# Patient Record
Sex: Male | Born: 1935 | Race: White | Hispanic: No | Smoking: Never smoker
Health system: Southern US, Community
[De-identification: ages and names within clinical notes are randomized; demographics above are authoritative.]

## PROBLEM LIST (undated history)

## (undated) DIAGNOSIS — I5022 Chronic systolic (congestive) heart failure: Secondary | ICD-10-CM

## (undated) DIAGNOSIS — N189 Chronic kidney disease, unspecified: Secondary | ICD-10-CM

## (undated) DIAGNOSIS — I219 Acute myocardial infarction, unspecified: Secondary | ICD-10-CM

## (undated) DIAGNOSIS — Z7901 Long term (current) use of anticoagulants: Secondary | ICD-10-CM

## (undated) DIAGNOSIS — I872 Venous insufficiency (chronic) (peripheral): Secondary | ICD-10-CM

## (undated) DIAGNOSIS — F0390 Unspecified dementia without behavioral disturbance: Secondary | ICD-10-CM

## (undated) DIAGNOSIS — I951 Orthostatic hypotension: Secondary | ICD-10-CM

## (undated) DIAGNOSIS — R413 Other amnesia: Secondary | ICD-10-CM

## (undated) DIAGNOSIS — I4892 Unspecified atrial flutter: Secondary | ICD-10-CM

## (undated) DIAGNOSIS — I4891 Unspecified atrial fibrillation: Secondary | ICD-10-CM

## (undated) DIAGNOSIS — I495 Sick sinus syndrome: Secondary | ICD-10-CM

## (undated) DIAGNOSIS — R55 Syncope and collapse: Secondary | ICD-10-CM

## (undated) DIAGNOSIS — G4730 Sleep apnea, unspecified: Secondary | ICD-10-CM

## (undated) DIAGNOSIS — I2510 Atherosclerotic heart disease of native coronary artery without angina pectoris: Secondary | ICD-10-CM

## (undated) HISTORY — PX: TONSILLECTOMY: SUR1361

## (undated) HISTORY — DX: Other amnesia: R41.3

## (undated) HISTORY — PX: CATARACT EXTRACTION: SUR2

---

## 1998-06-07 ENCOUNTER — Emergency Department (HOSPITAL_COMMUNITY): Admission: EM | Admit: 1998-06-07 | Discharge: 1998-06-07 | Payer: Self-pay | Admitting: Emergency Medicine

## 1998-06-07 ENCOUNTER — Encounter: Payer: Self-pay | Admitting: Emergency Medicine

## 2001-06-30 ENCOUNTER — Ambulatory Visit (HOSPITAL_COMMUNITY): Admission: RE | Admit: 2001-06-30 | Discharge: 2001-06-30 | Payer: Self-pay | Admitting: Gastroenterology

## 2001-09-13 ENCOUNTER — Emergency Department (HOSPITAL_COMMUNITY): Admission: EM | Admit: 2001-09-13 | Discharge: 2001-09-13 | Payer: Self-pay | Admitting: Emergency Medicine

## 2001-09-13 ENCOUNTER — Encounter: Payer: Self-pay | Admitting: Emergency Medicine

## 2007-09-06 ENCOUNTER — Ambulatory Visit: Payer: Self-pay | Admitting: Internal Medicine

## 2007-09-06 ENCOUNTER — Inpatient Hospital Stay (HOSPITAL_COMMUNITY): Admission: RE | Admit: 2007-09-06 | Discharge: 2007-09-06 | Payer: Self-pay | Admitting: *Deleted

## 2008-12-24 ENCOUNTER — Encounter: Admission: RE | Admit: 2008-12-24 | Discharge: 2008-12-24 | Payer: Self-pay | Admitting: Family Medicine

## 2009-06-01 IMAGING — CT CT ANGIO CHEST
1 of 4 series · 19 of 36 positions shown · IV contrast (APPLIED)
Comparison: None available

CLINICAL DATA: Chest pain, shortness of breath, ventral hernia

CT ANGIOGRAPHY CHEST
TECHNIQUE: Multidetector CT imaging of the chest using the
standard protocol during bolus administration of intravenous
contrast. Multiplanar reconstructed images obtained and reviewed to
evaluate the vascular anatomy.
Contrast: 80 ml Lmnipaque-GAA IV

[Series 6: pe 1.0 b40f thins for pacs · axial · 0.70mm/px · z∈[+97,+313]mm · 19 of 242 slices shown]
[im 13/242  lung]
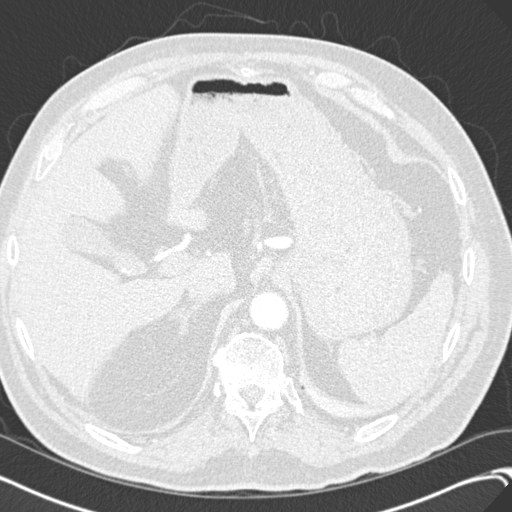
[im 25/242  mediastinal]
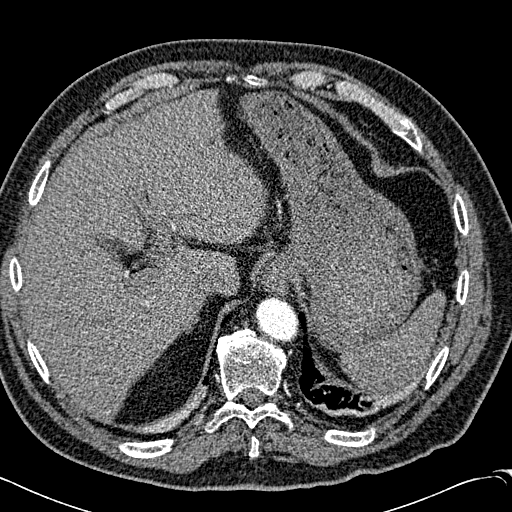
[im 37/242  lung]
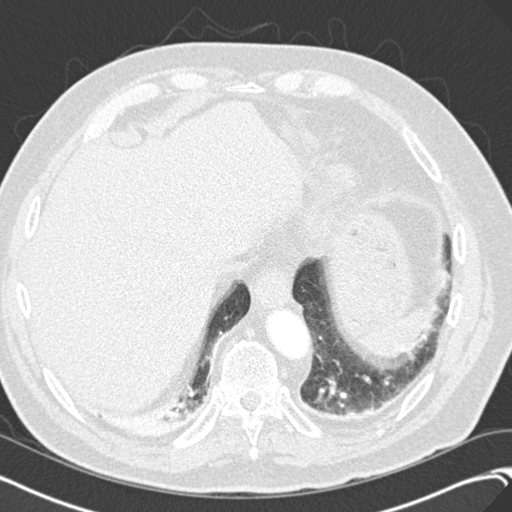
[im 49/242  mediastinal]
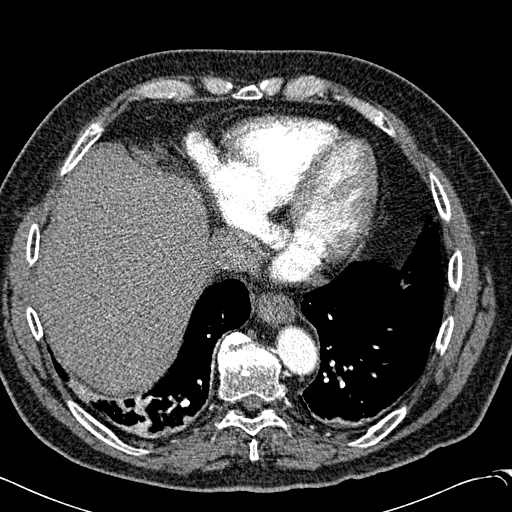
[im 61/242  lung]
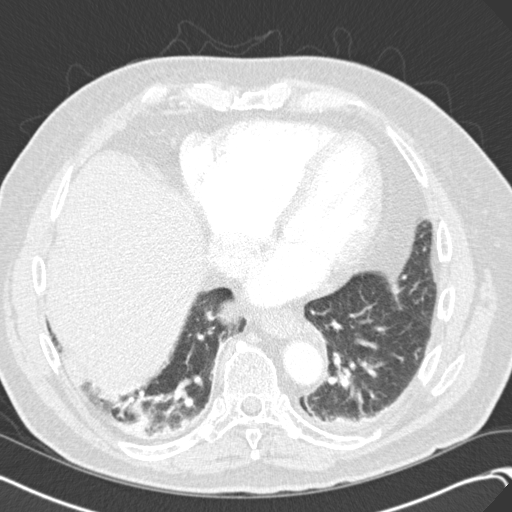
[im 73/242  mediastinal]
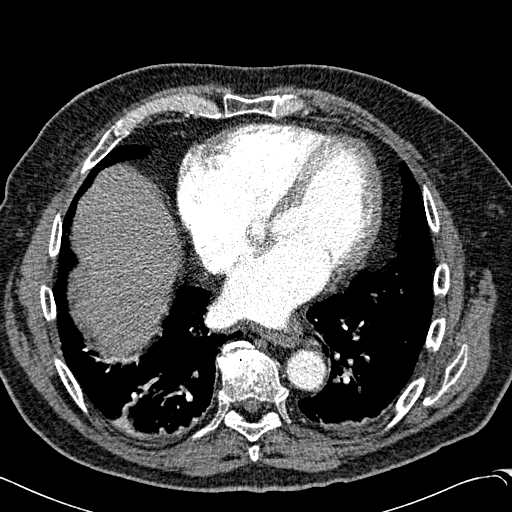
[im 85/242  lung]
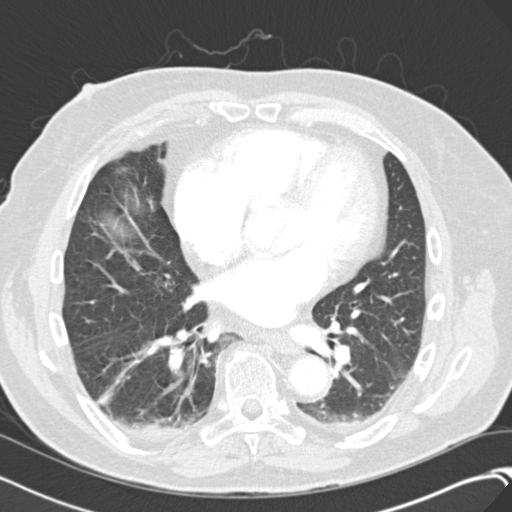
[im 97/242  mediastinal]
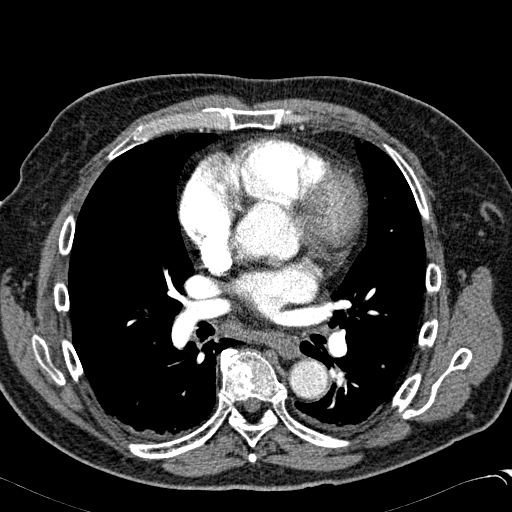
[im 109/242  lung]
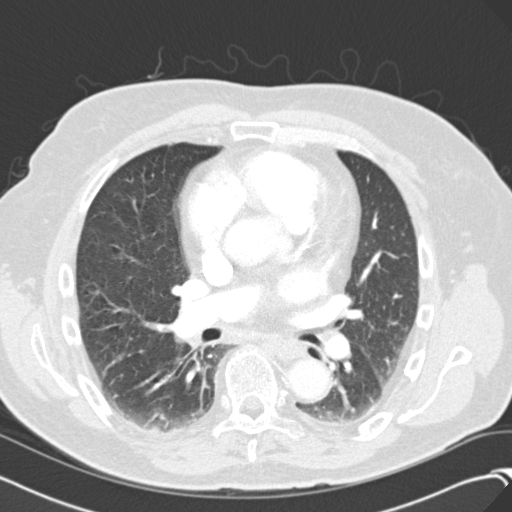
[im 121/242  mediastinal]
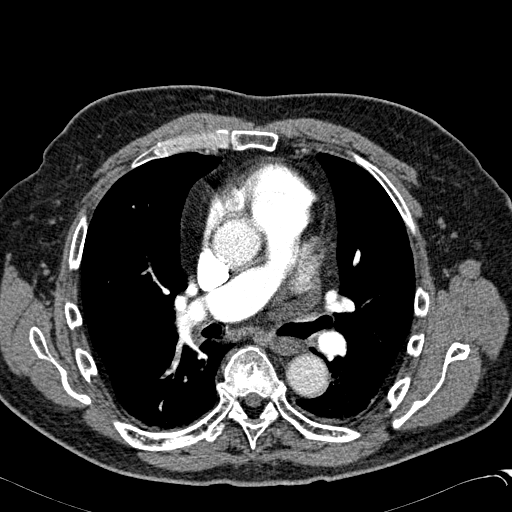
[im 133/242  lung]
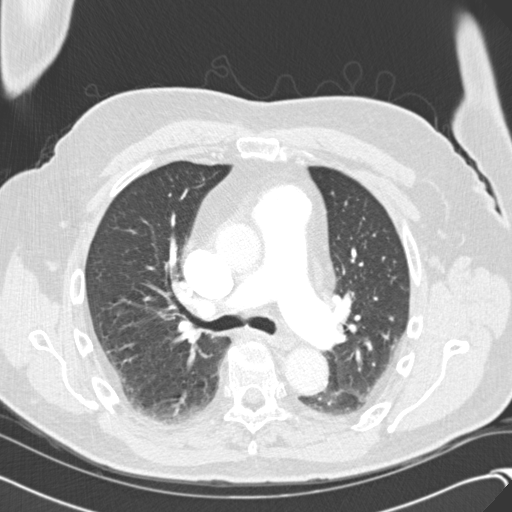
[im 145/242  mediastinal]
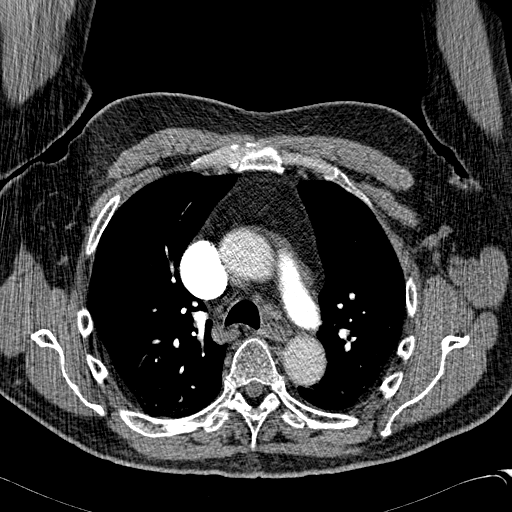
[im 157/242  lung]
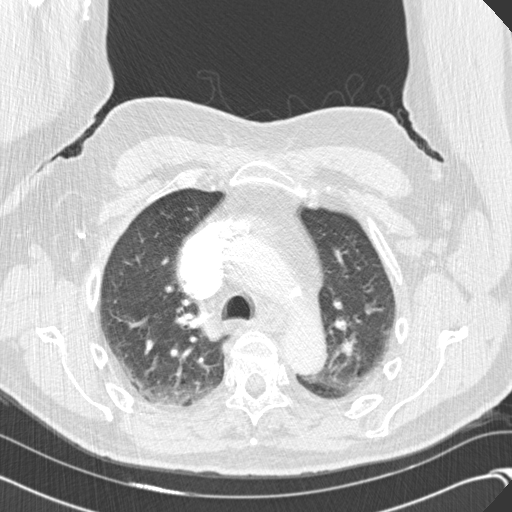
[im 169/242  mediastinal]
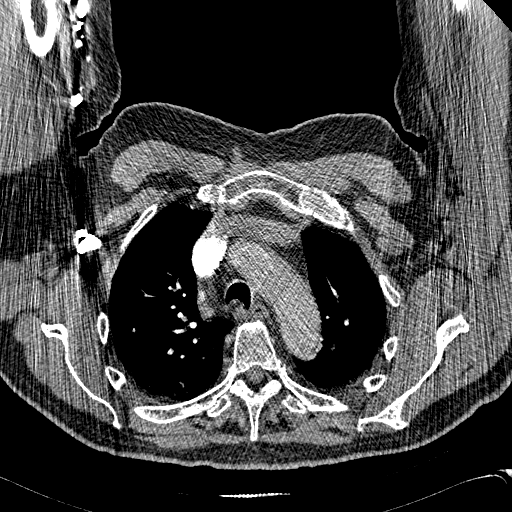
[im 181/242  lung]
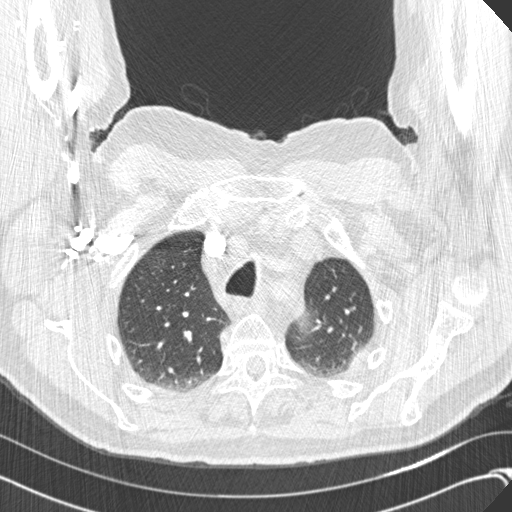
[im 193/242  mediastinal]
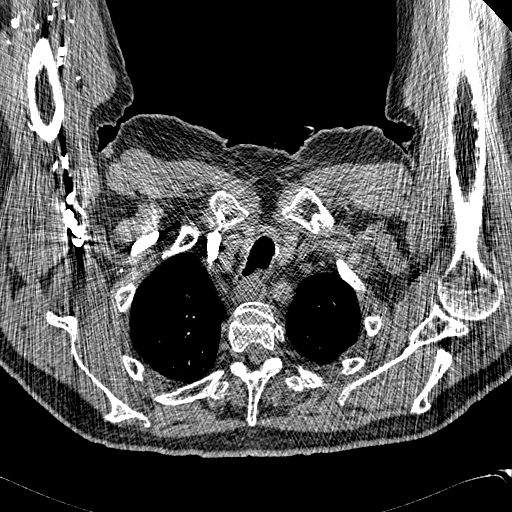
[im 205/242  lung]
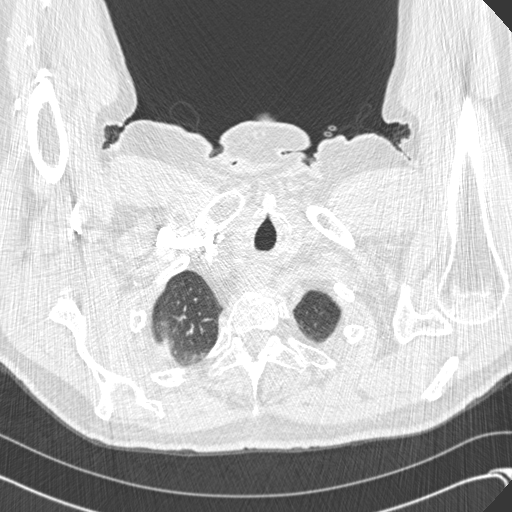
[im 217/242  mediastinal]
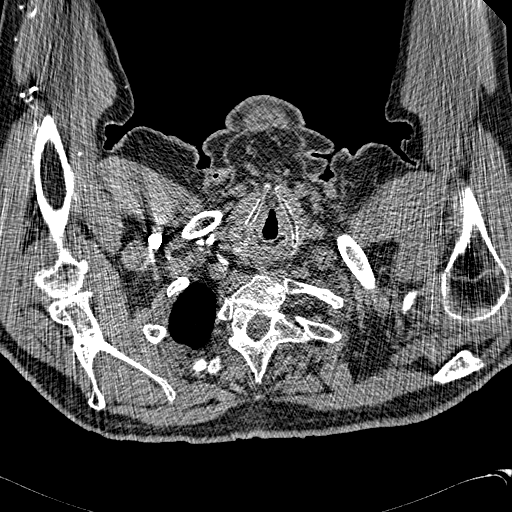
[im 229/242  lung]
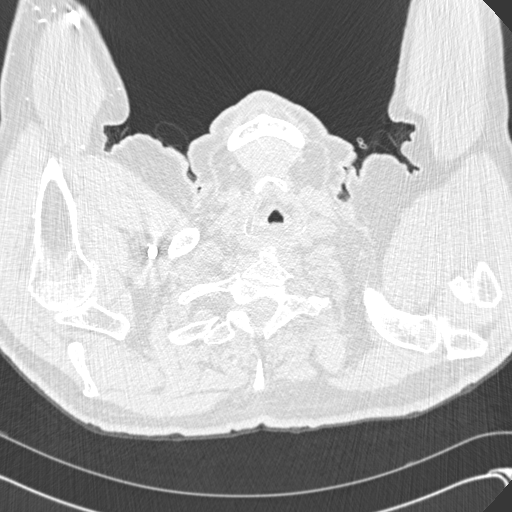

[19 of 36 positions shown; findings below may reference images not displayed]

FINDINGS: There is good contrast opacification of pulmonary artery
branches with no convincing filling defects to indicate acute PE.
Segments of the scan are degraded by patient breathing during the
acquisition.  There are trace bilateral pleural effusions.
Dependent atelectasis is noted posteriorly in both lower lobes.
There are coronary and aortic calcifications.  No pericardial
effusion.  Sub centimeter AP window and precarinal lymph nodes are
noted.  No hilar adenopathy.  High attenuation material layers in
the dependent portion of the gallbladder, suggesting small
partially calcified stones.  The remainder of the visualized upper
abdomen is unremarkable.
IMPRESSION: 1.  Negative for acute PE or thoracic aortic dissection.
2.  Coronary and aortic calcifications.
3.  Cholelithiasis.
4.  Trace bilateral pleural effusions

## 2010-07-01 NOTE — Op Note (Signed)
NAME:  Spencer Ramirez, Spencer Ramirez                 ACCOUNT NO.:  [id number]   MEDICAL RECORD NO.:  [id number]          PATIENT TYPE:  AMB   LOCATION:  DAY                          FACILITY:  [facility]   PHYSICIAN:  Alfonse Ras, MD   DATE OF BIRTH:  Aug 09, 1930   DATE OF PROCEDURE:  09/05/2007  DATE OF DISCHARGE:                               OPERATIVE REPORT   PREOPERATIVE DIAGNOSIS:  Supraumbilical ventral hernia.   POSTOPERATIVE DIAGNOSIS:  Supraumbilical ventral hernia.   PROCEDURES:  Laparoscopic ventral hernia repair with 12 cm composite  Parietex mesh.   ANESTHESIA:  General.   DESCRIPTION:  The patient was taken to the operating room, placed in  supine position.  After adequate general anesthesia was induced using  endotracheal tube, Foley catheter was placed and the abdomen was prepped  and draped in normal sterile fashion.  Using a 11 mm OptiVu in the left  upper quadrant, peritoneal access was obtained under direct vision.  Pneumoperitoneum was obtained.  Additional 11 mm and 5 mm trocars were  placed in the right abdomen.  The defect was easily visualized and a  very small piece of small bowel was easily reduced from the hernia sac.  The hernia defect itself measured approximately 3 to 4 cm internally.  It was closed primarily with a 2-0 Novofils, piece of 12 cm Parietex  mesh was then tacked to the anterior abdominal wall with interrupted #1  Novofils and a tacker to completely overlay the repair by about 4 cm in  all directions.  Adequate hemostasis was assured.  Pneumoperitoneum was  released.  Trocars were removed.  Skin incisions were closed with  subcuticular 4-0 Monocryl.  Steri-Strips and sterile dressings were  applied.  The patient tolerated the procedure well, went to PACU in good  condition.      Alfonse Ras, MD  Electronically Signed     KRE/MEDQ  D:  09/05/2007  T:  09/05/2007  Job:  [id number]

## 2010-07-01 NOTE — Consult Note (Signed)
Spencer Ramirez, Spencer Ramirez                 ACCOUNT NO.:  [id number]   MEDICAL RECORD NO.:  [id number]          PATIENT TYPE:  INP   LOCATION:  1333                         FACILITY:  [facility]   PHYSICIAN:  Valerie A. Felicity Coyer, MDDATE OF BIRTH:  04-17-1930   DATE OF CONSULTATION:  DATE OF DISCHARGE:                                 CONSULTATION   PRIMARY CARE PHYSICIAN:  Hart Carwin, MD.   I have been asked by Dr. Colin Benton to evaluate for postoperative hypoxia and  confusion.  This is a 77 year old white gentleman in amazingly good  health who was admitted from PACU yesterday following a laparoscopic  ventral hernia repair with mesh.  Overnight, the patient's oxygen  saturations have ranged from 80%-90% on 4 liters of nasal cannula per  report, though currently documented by nursing as 100% on 3 liters.  The  patient has experienced confusion overnight per the wife and daughter.  They report the patient now appears to be 95% himself, per the  daughter, still some difficulty on recall of events.   The patient denies any chest pain, shortness of breath or cough.  No  nausea, vomiting per the patient or family or in the PACU report as I  have reviewed.  The patient denies any abdominal pain.  He has received  no IV or p.o. pain medications during this hospitalization since coming  out of surgery.  He has no previous history of lung disease, including  no history of tobacco abuse.  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  No history of COPD or asthma.  He has no history of heart disease,  specifically no previous MI, known coronary disease, CHF or valve  disease.  At this time, the patient is feeling well and without  complaints.  Family is at the bedside.   PAST MEDICAL HISTORY:  None.  No chronic health issues or previous  surgeries.   MEDICATIONS:  Medications prior to admission: None.  Current MAR shows  p.r.n. Vicodin or morphine of which none has been reported as given.   ALLERGIES:  NO  KNOWN DRUG ALLERGIES.   FAMILY HISTORY:  Father died of stomach cancer.  Mother died at age 22.  No relatives with history of clots.   SOCIAL HISTORY:  He lives with his wife.  He is a retired [employer]  man.  He has never smoked.  Seldom alcohol use socially.   REVIEW OF SYSTEMS:  Please see HPI above.  He has had no recent illness  and was feeling well preop without fever or chills.  No sore throat or  sick contacts.  No sputum or cough.  No reflux or heartburn.  No lower  extremity swelling.  No history of cancer.  No history of clots  personally.  No injury or recent falls.  No dizziness or headaches.  Please see above and other systems have been reviewed, negative except  as listed.   PHYSICAL EXAMINATION:  VITAL SIGNS:  Temperature 97.5, blood pressure  120/79, pulse is 67, respirations 20, satting as reported 100% on 3  liters, but during my  evaluation note continuous pulse ox will show  desaturation into the 80s on nasal cannula while the patient is talking.  GENERAL:  In general, he is awake, alert and oriented x3.  Daughter and  wife are at bedside.  He is reading the newspaper and sitting upright in  no acute distress.  HEENT:  Eyes show PERRL.  EOMI.  No scleral icterus or conjunctival  irritation.  ENT is grossly normal hearing.  Oropharynx clear without  lesion good dentition.  Mucous membranes moist.  RESPIRATORY:  Respiratory shows trachea midline without deviation.  Lungs bilaterally clear to auscultation with good air movement.  There  is no wheeze, no rhonchi.  No crackle.  No increased work of breathing  or use of accessory muscles.  CARDIOVASCULAR:  Cardiovascular is regular rate and rhythm.  Very soft  1/6 systolic murmur, which is not new per daughter.  EXTREMITIES:  Extremities are without edema in the bilateral legs.  SCDs  are present bilaterally.  ABDOMEN:  Abdomen is protuberant, slightly distended with positive bowel  sounds.  Dressing is clean, dry  and intact and not otherwise examined.  There is no appreciable mass.  No rebound or guarding.  MUSCULOSKELETAL:  There are no joint deformities or malformations.  No  effusions.  Gait has not been tested.  NEUROLOGICALLY:  Cranial nerves II-XII are symmetrically intact.  He is  moving all extremities spontaneously without deficit, and there are no  other gross cognitive deficits PSYCHIATRIC:  Psychiatrically awake,  alert, oriented x4, normal mood and pleasant affect at baseline.   LABORATORY DATA:  Preop hemoglobin of 12.3.  Basic metabolic:  Normal  electrolytes, normal renal function, glucose of 91.  EKG yesterday at  7:06 a.m. shows normal sinus rhythm at 65 beats per minute with a mild  pressure AV block.  PR interval 0.216.  Two view chest x-ray just  performed questions right small pneumothorax versus artifact.  Mild  right lower lobe atelectasis and a question of very small pleural  effusions.  Recommend left lateral decubitus for further evaluation.   PLAN:  Hypoxia, primarily conversational and exertional, though room air  resting saturation has not been documented.  This is witnessed by myself  on continuous O2 sat monitor during the interview.  Suspect this is  real, as oxygen recording rises with nasal inspiration of his nasal  cannula oxygen with cessation of conversation and rest.  Therefore,  doubt pulse oximeter malfunction.  Will check ABG on room air if  possible to verify accuracy of this.  There are no significant findings  on exam or chest x-ray with the exception of a possible question  pneumothorax, therefore check left lateral decubitus per radiology  report recommendation and if negative for pneumothorax, will pursue CT  chest to rule out PE or other underlying pulmonary disease.  Doubt that  there is a cardiac cause, as there is no overt pulmonary edema on chest  x-ray, no clear clinical symptoms of angina, but certainly an  intraoperative event given the  patient's age is possible.  Will check  EKG now, as well as a BNP and cardiac enzymes.   At this time, the patient appears generally asymptomatic.  The family  notes there was some confusion overnight, likely related to hypoxia.  Will also check CBC and BMP to rule out leukocytosis such as indicating  of an underlying infection, new anemia or electrolyte/renal dysfunction.  Please see orders for further workup and details.  I  appreciate  the  consultation and will follow-up on these results.      Valerie A. Felicity Coyer, MD  Electronically Signed     VAL/MEDQ  D:  09/06/2007  T:  09/06/2007  Job:  [id number]

## 2010-11-14 LAB — BASIC METABOLIC PANEL
Calcium: 8.7
Chloride: 107
GFR calc Af Amer: >60
GFR calc non Af Amer: 59 — ABNORMAL LOW
GFR calc non Af Amer: >60
Glucose, Bld: 91
Potassium: 3.7
Potassium: 4
Sodium: 138
Sodium: 140

## 2010-11-14 LAB — BLOOD GAS, ARTERIAL
Drawn by: 101791
FIO2: 0.21
O2 Saturation: 93
Patient temperature: 98.6

## 2010-11-14 LAB — HEMOGLOBIN AND HEMATOCRIT, BLOOD: Hemoglobin: 12.3 — ABNORMAL LOW

## 2010-11-14 LAB — CARDIAC PANEL(CRET KIN+CKTOT+MB+TROPI)
CK, MB: 3
Relative Index: 2.1
Total CK: 143

## 2010-11-14 LAB — CBC
MCHC: 32.6
Platelets: 136 — ABNORMAL LOW
RDW: 17.6 — ABNORMAL HIGH

## 2010-11-14 LAB — B-NATRIURETIC PEPTIDE (CONVERTED LAB): Pro B Natriuretic peptide (BNP): 237 — ABNORMAL HIGH

## 2011-09-16 ENCOUNTER — Other Ambulatory Visit: Payer: Self-pay | Admitting: Cardiology

## 2011-09-16 NOTE — H&P (Signed)
 Spencer Ramirez, Spencer Ramirez    Date of visit:  09/16/2011 DOB:  02/10/1931    Age:  78 yrs. Medical record number:  [id number]     Account number:  [id number] Primary Care Provider: ELKINS, WILSON ____________________________ CURRENT DIAGNOSES  1. Congestive Heart Failure Chronic Systolic  2. Cardiomyopathy Non-Obstructive  3. Arrhythmia-Atrial Flutter  4. Chronic venous insufficiency  5. Chronic Kidney Disease (Stage 3)  6. Long Term Use Anticoagulant  7. Dementia  8. Atrial fibrillation ____________________________ ALLERGIES  NKDA ____________________________ MEDICATIONS  1. furosemide 40 mg tablet, 1 p.o. daily  2. lisinopril 10 mg tablet, 1/2 tab daily  3. hydroxyzine HCl 10 mg tablet, PRN  4. Tylenol Ex Str Rapid Release 500 mg tablet, PRN  5. Calcium 600 600 mg (1,500 mg) tablet, BID  6. Klor-Con M20 20 mEq tablet,ER particles/crystals, qd  7. carvedilol 6.25 mg tablet, BID  8. warfarin 5 mg tablet, 1 p.o. daily  9. multivitamin tablet, 1 p.o. daily ____________________________ CHIEF COMPLAINTS  Followup of Arrhythmia-Atrial Flutter ____________________________ HISTORY OF PRESENT ILLNESS  Patient seen for evaluation prior to cardioversion. He has a recent diagnosis of a cardiomyopathy and also has some dementia. He was also found to be in atrial flutter with slow response. He has been instituted under treatment with lisinopril as well as carvedilol and was placed on warfarin. He also has stage III chronic kidney disease. Because of some dementia the decision has been made to treat him conservatively with medical therapy. We talked about doing cardioversion on him to see if it would improve his symptoms of dyspnea and he has now been therapeutically anticoagulated for greater than 3 weeks by pro time at Dr. Elkins office. He is brought in at this time for cardioversion. He denies angina. He does have some mild dyspnea with exertion. His wife died last year.  ____________________________ PAST  HISTORY  Past Medical Illnesses:  chronic kidney disease Stage 3, cholelithiasis, chronic venous insufficiency;  Cardiovascular Illnesses:  atrial flutter, cardiomyopathy(idiopathic);  Surgical Procedures:  inguinal herniorrhaphy-left;  Cardiology Procedures-Invasive:  no history of prior cardiac procedures;  Cardiology Procedures-Noninvasive:  echocardiogram May 2013, lexiscan cardiolite May 2013;  LVEF of 33% documented via nuclear study on 07/02/2011  CHADS Score:  2  CHA2DS2-VASC Score:  3 ____________________________ CARDIO-PULMONARY TEST DATES EKG Date:  09/16/2011;  Nuclear Study Date:  07/02/2011;  Echocardiography Date: 07/01/2011;  Chest Xray Date: 05/04/2011;   ____________________________ SOCIAL HISTORY Alcohol Use:  does not use alcohol;  Smoking:  nonsmoker;  Diet:  regular diet;  Lifestyle:  widower;  Exercise:  no regular exercise;  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  Eyes:  wears eye glasses/contact lenses  Ears, Nose, Throat, Mouth:  denies any hearing loss, epistaxis, hoarseness or difficulty speaking.  Respiratory:  moderate dyspnea with exertion  Cardiovascular:  please review HPI  Abdominal:  denies dyspepsia, GI bleeding, constipation, or diarrhea  Genitourinary-Male:  frequency, nocturia  Musculoskeletal:  Varicose veins  Neurological:  occasional headaches ____________________________ PHYSICAL EXAMINATION VITAL SIGNS  Blood Pressure:  98/60 Sitting, Left arm, regular cuff  , 104/60 Standing, Left arm and regular cuff   Pulse:  80/min. Weight:  175.00 lbs. Height:  70"BMI: 25  Constitutional:  pleasant white male in no acute distress Skin:  warm and dry   to touch, no apparent skin lesions, or masses noted. Head:  normocephalic,  normal hair pattern, no masses or tenderness Eyes:  EOMS Intact, PERRLA, C and S clear, Funduscopic exam not done. ENT:  ears, nose and throat reveal no gross abnormalities.  Dentition good. Neck:  supple, without massess. No JVD, thyromegaly or carotid bruits. Carotid upstroke normal. Chest:  normal symmetry, clear to auscultation and percussion. Cardiac:  irregular rhythm, normal S1 and S2, No S3 or S4, no murmurs, gallops or rubs detected. Abdomen:  abdomen soft,non-tender, no masses, no hepatospenomegaly, or aneurysm noted Peripheral Pulses:  the femoral,dorsalis pedis, and posterior tibial pulses are full and equal bilaterally with no bruits auscultated. Extremities & Back:  no edema present Neurological:  not oriented to date, mildly abnormal gait ____________________________ IMPRESSIONS/PLAN  1. Atrial flutter with controlled response 2. Cardiomyopathy undetermined origin 3. Stage III chronic kidney disease 4. Dementia 5. Chronic venous insufficiency 6. Long-term anticoagulation with warfarin  Recommendations:  Brought in for same day elective cardioversion. Procedure was discussed fully with patient and granddaughter and he is willing to proceed. Risks of the procedure as well as possibility of reverting back to atrial flutter also discussed.  ____________________________ TODAYS ORDERS  1. 12 Lead EKG: Today  2. Electrical Cardioversion: First Available  3. Comprehensive Metabolic Panel: Today  4. Complete Blood Count: Today                       ____________________________ Cardiology Physician:  W. Spencer Govanni Plemons, Jr. MD [facility] up the 

## 2011-09-17 ENCOUNTER — Ambulatory Visit (HOSPITAL_COMMUNITY): Payer: Medicare Other | Admitting: Certified Registered"

## 2011-09-17 ENCOUNTER — Ambulatory Visit (HOSPITAL_COMMUNITY)
Admission: RE | Admit: 2011-09-17 | Discharge: 2011-09-17 | Disposition: A | Discharge status: Home / Self Care | Payer: Medicare Other | Source: Ambulatory Visit | Attending: Cardiology | Admitting: Cardiology

## 2011-09-17 ENCOUNTER — Encounter (HOSPITAL_COMMUNITY): Payer: Self-pay | Admitting: Certified Registered"

## 2011-09-17 ENCOUNTER — Encounter (HOSPITAL_COMMUNITY): Payer: Self-pay | Admitting: *Deleted

## 2011-09-17 ENCOUNTER — Encounter (HOSPITAL_COMMUNITY): Payer: Self-pay

## 2011-09-17 ENCOUNTER — Encounter (HOSPITAL_COMMUNITY)
Admission: RE | Disposition: A | Discharge status: Home / Self Care | Payer: Self-pay | Source: Ambulatory Visit | Attending: Cardiology

## 2011-09-17 ENCOUNTER — Ambulatory Visit (HOSPITAL_COMMUNITY): Admit: 2011-09-17 | Payer: Self-pay | Admitting: Internal Medicine

## 2011-09-17 DIAGNOSIS — I509 Heart failure, unspecified: Secondary | ICD-10-CM | POA: Insufficient documentation

## 2011-09-17 DIAGNOSIS — I4892 Unspecified atrial flutter: Secondary | ICD-10-CM

## 2011-09-17 DIAGNOSIS — F0390 Unspecified dementia without behavioral disturbance: Secondary | ICD-10-CM | POA: Insufficient documentation

## 2011-09-17 DIAGNOSIS — I429 Cardiomyopathy, unspecified: Secondary | ICD-10-CM

## 2011-09-17 DIAGNOSIS — I428 Other cardiomyopathies: Secondary | ICD-10-CM | POA: Insufficient documentation

## 2011-09-17 DIAGNOSIS — Z7901 Long term (current) use of anticoagulants: Secondary | ICD-10-CM

## 2011-09-17 DIAGNOSIS — N183 Chronic kidney disease, stage 3 unspecified: Secondary | ICD-10-CM

## 2011-09-17 DIAGNOSIS — Z4901 Encounter for fitting and adjustment of extracorporeal dialysis catheter: Secondary | ICD-10-CM | POA: Insufficient documentation

## 2011-09-17 DIAGNOSIS — I872 Venous insufficiency (chronic) (peripheral): Secondary | ICD-10-CM | POA: Insufficient documentation

## 2011-09-17 DIAGNOSIS — I5022 Chronic systolic (congestive) heart failure: Secondary | ICD-10-CM

## 2011-09-17 HISTORY — DX: Unspecified dementia, unspecified severity, without behavioral disturbance, psychotic disturbance, mood disturbance, and anxiety: F03.90

## 2011-09-17 HISTORY — DX: Long term (current) use of anticoagulants: Z79.01

## 2011-09-17 HISTORY — DX: Sleep apnea, unspecified: G47.30

## 2011-09-17 HISTORY — PX: CARDIOVERSION: SHX1299

## 2011-09-17 HISTORY — DX: Chronic kidney disease, unspecified: N18.9

## 2011-09-17 SURGERY — CARDIOVERSION
Anesthesia: Monitor Anesthesia Care | Wound class: Clean

## 2011-09-17 SURGERY — CARDIOVERSION
Anesthesia: Monitor Anesthesia Care

## 2011-09-17 MED ORDER — SODIUM CHLORIDE 0.9 % IV SOLN: 250.0000 mL | INTRAVENOUS | Status: DC

## 2011-09-17 MED ORDER — SODIUM CHLORIDE 0.9 % IJ SOLN: 3.0000 mL | Freq: Two times a day (BID) | INTRAMUSCULAR | Status: DC

## 2011-09-17 MED ORDER — SODIUM CHLORIDE 0.9 % IJ SOLN: 3.0000 mL | INTRAMUSCULAR | Status: DC | PRN

## 2011-09-17 MED ORDER — SODIUM CHLORIDE 0.9 % IV SOLN
INTRAVENOUS | Status: DC
  Administered 2011-09-17: 09:00:00 via INTRAVENOUS

## 2011-09-17 MED ORDER — PROPOFOL 10 MG/ML IV EMUL
INTRAVENOUS | Status: DC | PRN
  Administered 2011-09-17: 40 mg via INTRAVENOUS

## 2011-09-17 MED ORDER — HYDROCORTISONE 1 % EX CREA: 1.0000 "application " | TOPICAL_CREAM | Freq: Three times a day (TID) | CUTANEOUS | Status: DC | PRN

## 2011-09-17 NOTE — H&P (View-Only) (Signed)
Spencer Ramirez    Date of visit:  09/16/2011 DOB:  June 04, 1930    Age:  78 yrs. Medical record number:  [id number]     Account number:  [id number] Primary Care Provider: Windle Guard ____________________________ CURRENT DIAGNOSES  1. Congestive Heart Failure Chronic Systolic  2. Cardiomyopathy Non-Obstructive  3. Arrhythmia-Atrial Flutter  4. Chronic venous insufficiency  5. Chronic Kidney Disease (Stage 3)  6. Long Term Use Anticoagulant  7. Dementia  8. Atrial fibrillation ____________________________ ALLERGIES  NKDA ____________________________ MEDICATIONS  1. furosemide 40 mg tablet, 1 p.o. daily  2. lisinopril 10 mg tablet, 1/2 tab daily  3. hydroxyzine HCl 10 mg tablet, PRN  4. Tylenol Ex Str Rapid Release 500 mg tablet, PRN  5. Calcium 600 600 mg (1,500 mg) tablet, BID  6. Klor-Con M20 20 mEq tablet,ER particles/crystals, qd  7. carvedilol 6.25 mg tablet, BID  8. warfarin 5 mg tablet, 1 p.o. daily  9. multivitamin tablet, 1 p.o. daily ____________________________ CHIEF COMPLAINTS  Followup of Arrhythmia-Atrial Flutter ____________________________ HISTORY OF PRESENT ILLNESS  Patient seen for evaluation prior to cardioversion. He has a recent diagnosis of a cardiomyopathy and also has some dementia. He was also found to be in atrial flutter with slow response. He has been instituted under treatment with lisinopril as well as carvedilol and was placed on warfarin. He also has stage III chronic kidney disease. Because of some dementia the decision has been made to treat him conservatively with medical therapy. We talked about doing cardioversion on him to see if it would improve his symptoms of dyspnea and he has now been therapeutically anticoagulated for greater than 3 weeks by pro time at Dr. Jeannetta Nap office. He is brought in at this time for cardioversion. He denies angina. He does have some mild dyspnea with exertion. His wife died last year.  ____________________________ PAST  HISTORY  Past Medical Illnesses:  chronic kidney disease Stage 3, cholelithiasis, chronic venous insufficiency;  Cardiovascular Illnesses:  atrial flutter, cardiomyopathy(idiopathic);  Surgical Procedures:  inguinal herniorrhaphy-left;  Cardiology Procedures-Invasive:  no history of prior cardiac procedures;  Cardiology Procedures-Noninvasive:  echocardiogram May 2013, lexiscan cardiolite May 2013;  LVEF of 33% documented via nuclear study on 07/02/2011  CHADS Score:  2  CHA2DS2-VASC Score:  3 ____________________________ CARDIO-PULMONARY TEST DATES EKG Date:  09/16/2011;  Nuclear Study Date:  07/02/2011;  Echocardiography Date: 07/01/2011;  Chest Xray Date: 05/04/2011;   ____________________________ SOCIAL HISTORY Alcohol Use:  does not use alcohol;  Smoking:  nonsmoker;  Diet:  regular diet;  Lifestyle:  widower;  Exercise:  no regular exercise;  Occupation:  Patent examiner, retired [employer];  Residence:  lives alone, has grandaughter that checks on him, daughter lives near [place] and 2 sons;   ____________________________ REVIEW OF SYSTEMS General:  malaise and fatigue Integumentary:  no rashes or new skin lesions.  Eyes:  wears eye glasses/contact lenses  Ears, Nose, Throat, Mouth:  denies any hearing loss, epistaxis, hoarseness or difficulty speaking.  Respiratory:  moderate dyspnea with exertion  Cardiovascular:  please review HPI  Abdominal:  denies dyspepsia, GI bleeding, constipation, or diarrhea  Genitourinary-Male:  frequency, nocturia  Musculoskeletal:  Varicose veins  Neurological:  occasional headaches ____________________________ PHYSICAL EXAMINATION VITAL SIGNS  Blood Pressure:  98/60 Sitting, Left arm, regular cuff  , 104/60 Standing, Left arm and regular cuff   Pulse:  80/min. Weight:  175.00 lbs. Height:  70"BMI: 25  Constitutional:  pleasant white male in no acute distress Skin:  warm and dry  to touch, no apparent skin lesions, or masses noted. Head:  normocephalic,  normal hair pattern, no masses or tenderness Eyes:  EOMS Intact, PERRLA, C and S clear, Funduscopic exam not done. ENT:  ears, nose and throat reveal no gross abnormalities.  Dentition good. Neck:  supple, without massess. No JVD, thyromegaly or carotid bruits. Carotid upstroke normal. Chest:  normal symmetry, clear to auscultation and percussion. Cardiac:  irregular rhythm, normal S1 and S2, No S3 or S4, no murmurs, gallops or rubs detected. Abdomen:  abdomen soft,non-tender, no masses, no hepatospenomegaly, or aneurysm noted Peripheral Pulses:  the femoral,dorsalis pedis, and posterior tibial pulses are full and equal bilaterally with no bruits auscultated. Extremities & Back:  no edema present Neurological:  not oriented to date, mildly abnormal gait ____________________________ IMPRESSIONS/PLAN  1. Atrial flutter with controlled response 2. Cardiomyopathy undetermined origin 3. Stage III chronic kidney disease 4. Dementia 5. Chronic venous insufficiency 6. Long-term anticoagulation with warfarin  Recommendations:  Brought in for same day elective cardioversion. Procedure was discussed fully with patient and granddaughter and he is willing to proceed. Risks of the procedure as well as possibility of reverting back to atrial flutter also discussed.  ____________________________ TODAYS ORDERS  1. 12 Lead EKG: Today  2. Electrical Cardioversion: First Available  3. Comprehensive Metabolic Panel: Today  4. Complete Blood Count: Today                       ____________________________ Cardiology Physician:  [name] MD [facility] up the

## 2011-09-17 NOTE — Preoperative (Signed)
Beta Blockers   Reason not to administer Beta Blockers:Carvedilol at 0800hrs 09/17/11

## 2011-09-17 NOTE — Transfer of Care (Signed)
Immediate Anesthesia Transfer of Care Note  Patient: Spencer Ramirez  Procedure(s) Performed: Procedure(s) (LRB): CARDIOVERSION (N/A)  Patient Location: Endoscopy Unit  Anesthesia Type: General  Level of Consciousness: sedated  Airway & Oxygen Therapy: Patient Spontanous Breathing and Patient connected to nasal cannula oxygen  Post-op Assessment: Report given to PACU RN, Post -op Vital signs reviewed and stable and Patient moving all extremities  Post vital signs: Reviewed and stable  Complications: No apparent anesthesia complications

## 2011-09-17 NOTE — Anesthesia Preprocedure Evaluation (Signed)
Anesthesia Evaluation  Patient identified by MRN, date of birth, ID band Patient awake    Reviewed: Allergy & Precautions, H&P , NPO status , Patient's Chart, lab work & pertinent test results  Airway Mallampati: II TM Distance: >3 FB Neck ROM: Full    Dental   Pulmonary shortness of breath, sleep apnea ,          Cardiovascular +CHF + dysrhythmias     Neuro/Psych    GI/Hepatic   Endo/Other    Renal/GU      Musculoskeletal   Abdominal   Peds  Hematology   Anesthesia Other Findings   Reproductive/Obstetrics                           Anesthesia Physical Anesthesia Plan  ASA: III  Anesthesia Plan: General   Post-op Pain Management:    Induction: Intravenous  Airway Management Planned: Mask  Additional Equipment:   Intra-op Plan:   Post-operative Plan: Extubation in OR  Informed Consent: I have reviewed the patients History and Physical, chart, labs and discussed the procedure including the risks, benefits and alternatives for the proposed anesthesia with the patient or authorized representative who has indicated his/her understanding and acceptance.     Plan Discussed with: CRNA, Anesthesiologist and Surgeon  Anesthesia Plan Comments:         Anesthesia Quick Evaluation

## 2011-09-17 NOTE — Anesthesia Postprocedure Evaluation (Signed)
  Anesthesia Post-op Note  Patient: Spencer Ramirez  Procedure(s) Performed: Procedure(s) (LRB): CARDIOVERSION (N/A)  Patient Location: PACU and Endoscopy Unit  Anesthesia Type: General  Level of Consciousness: awake, alert , sedated and patient cooperative  Airway and Oxygen Therapy: Patient Spontanous Breathing and Patient connected to nasal cannula oxygen  Post-op Pain: none  Post-op Assessment: Post-op Vital signs reviewed, Patient's Cardiovascular Status Stable, Respiratory Function Stable, Patent Airway, No signs of Nausea or vomiting and Pain level controlled  Post-op Vital Signs: stable  Complications: No apparent anesthesia complications

## 2011-09-17 NOTE — Interval H&P Note (Signed)
History and Physical Interval Note:  09/17/2011 10:03 AM  Spencer Ramirez  has presented today for cardioversion, with the diagnosis of a-flutter  The various methods of treatment have been discussed with the patient and family. After consideration of risks, benefits and other options for treatment, the patient has consented to  cardioversion.  The patient's history has been reviewed, patient examined, no change in status, stable for surgery.  I have reviewed the patient's chart and labs.  Questions were answered to the patient's satisfaction.    [name] MD [facility]

## 2011-09-17 NOTE — CV Procedure (Signed)
Electrical Cardioversion Procedure Note  Spencer Ramirez   78 y.o. male MRN: [id number] DOB: December 31, 1930  Today's date: 09/17/2011  Procedure: Electrical Cardioversion  Indications:  Atrial Flutter  Time Out: Verified patient identification, verified procedure,medications/allergies/relevent history reviewed, required imaging and test results available.  Performed  Procedure Details  The patient was NPO after midnight. Anesthesia was administered at the beside  by Dr.Greg Katrinka Blazing with 40 mg of propofol.  Cardioversion was done with synchronized biphasic defibrillation with AP pads with 100 watts.  The patient converted to normal sinus rhythm. The patient tolerated the procedure well   IMPRESSION:  Successful cardioversion of atrial flutter    W. Viann Fish, Montez Hageman. MD [facility]   09/17/2011, 10:16 AM

## 2011-09-17 NOTE — Anesthesia Postprocedure Evaluation (Signed)
  Anesthesia Post-op Note  Patient: Spencer Ramirez  Procedure(s) Performed: Procedure(s) (LRB): CARDIOVERSION (N/A)  Patient Location: Endoscopy Unit  Anesthesia Type: General  Level of Consciousness: awake  Airway and Oxygen Therapy: Patient Spontanous Breathing and Patient connected to nasal cannula oxygen  Post-op Pain: none  Post-op Assessment: Post-op Vital signs reviewed, Patient's Cardiovascular Status Stable, Respiratory Function Stable, Patent Airway and No signs of Nausea or vomiting  Post-op Vital Signs: Reviewed and stable  Complications: No apparent anesthesia complications

## 2011-09-18 ENCOUNTER — Encounter (HOSPITAL_COMMUNITY): Payer: Self-pay | Admitting: Cardiology

## 2011-10-17 ENCOUNTER — Other Ambulatory Visit: Payer: Self-pay | Admitting: Cardiology

## 2012-03-21 ENCOUNTER — Other Ambulatory Visit: Payer: Self-pay | Admitting: Family Medicine

## 2012-03-21 ENCOUNTER — Other Ambulatory Visit (HOSPITAL_COMMUNITY): Payer: Self-pay

## 2012-03-21 ENCOUNTER — Ambulatory Visit
Admission: RE | Admit: 2012-03-21 | Discharge: 2012-03-21 | Disposition: A | Discharge status: Home / Self Care | Payer: Medicare Other | Source: Ambulatory Visit | Attending: Family Medicine | Admitting: Family Medicine

## 2012-03-21 DIAGNOSIS — R259 Unspecified abnormal involuntary movements: Secondary | ICD-10-CM

## 2012-03-21 DIAGNOSIS — R569 Unspecified convulsions: Secondary | ICD-10-CM

## 2012-03-23 ENCOUNTER — Inpatient Hospital Stay (HOSPITAL_COMMUNITY): Admission: RE | Admit: 2012-03-23 | Payer: Medicare Other | Source: Ambulatory Visit

## 2012-03-24 ENCOUNTER — Encounter (HOSPITAL_COMMUNITY): Payer: Self-pay

## 2012-03-24 ENCOUNTER — Inpatient Hospital Stay (HOSPITAL_COMMUNITY)
Admission: AD | Admit: 2012-03-24 | Discharge: 2012-03-27 | DRG: 243 | Disposition: A | Discharge status: Home / Self Care | Payer: Medicare Other | Source: Ambulatory Visit | Attending: Cardiology | Admitting: Cardiology

## 2012-03-24 ENCOUNTER — Inpatient Hospital Stay (HOSPITAL_COMMUNITY): Payer: Medicare Other

## 2012-03-24 DIAGNOSIS — Z7901 Long term (current) use of anticoagulants: Secondary | ICD-10-CM

## 2012-03-24 DIAGNOSIS — I4892 Unspecified atrial flutter: Secondary | ICD-10-CM | POA: Diagnosis present

## 2012-03-24 DIAGNOSIS — N184 Chronic kidney disease, stage 4 (severe): Secondary | ICD-10-CM | POA: Diagnosis present

## 2012-03-24 DIAGNOSIS — I429 Cardiomyopathy, unspecified: Secondary | ICD-10-CM

## 2012-03-24 DIAGNOSIS — Z950 Presence of cardiac pacemaker: Secondary | ICD-10-CM

## 2012-03-24 DIAGNOSIS — I495 Sick sinus syndrome: Principal | ICD-10-CM | POA: Diagnosis present

## 2012-03-24 DIAGNOSIS — Z79899 Other long term (current) drug therapy: Secondary | ICD-10-CM

## 2012-03-24 DIAGNOSIS — G4730 Sleep apnea, unspecified: Secondary | ICD-10-CM | POA: Diagnosis present

## 2012-03-24 DIAGNOSIS — R55 Syncope and collapse: Secondary | ICD-10-CM | POA: Diagnosis present

## 2012-03-24 DIAGNOSIS — I951 Orthostatic hypotension: Secondary | ICD-10-CM

## 2012-03-24 DIAGNOSIS — I2589 Other forms of chronic ischemic heart disease: Secondary | ICD-10-CM | POA: Diagnosis present

## 2012-03-24 DIAGNOSIS — F0390 Unspecified dementia without behavioral disturbance: Secondary | ICD-10-CM | POA: Diagnosis present

## 2012-03-24 DIAGNOSIS — I872 Venous insufficiency (chronic) (peripheral): Secondary | ICD-10-CM | POA: Diagnosis present

## 2012-03-24 DIAGNOSIS — I5022 Chronic systolic (congestive) heart failure: Secondary | ICD-10-CM | POA: Diagnosis present

## 2012-03-24 DIAGNOSIS — I498 Other specified cardiac arrhythmias: Secondary | ICD-10-CM

## 2012-03-24 HISTORY — DX: Acute myocardial infarction, unspecified: I21.9

## 2012-03-24 HISTORY — DX: Sick sinus syndrome: I49.5

## 2012-03-24 HISTORY — DX: Atherosclerotic heart disease of native coronary artery without angina pectoris: I25.10

## 2012-03-24 LAB — CBC WITH DIFFERENTIAL/PLATELET
Basophils Relative: 1 % (ref 0–1)
Eosinophils Absolute: 0.2 10*3/uL (ref 0.0–0.7)
Eosinophils Relative: 2 % (ref 0–5)
HCT: 39.3 % (ref 39.0–52.0)
Hemoglobin: 13.5 g/dL (ref 13.0–17.0)
Lymphs Abs: 2.9 10*3/uL (ref 0.7–4.0)
MCH: 32.8 pg (ref 26.0–34.0)
MCHC: 34.4 g/dL (ref 30.0–36.0)
MCV: 95.4 fL (ref 78.0–100.0)
Monocytes Absolute: 0.6 10*3/uL (ref 0.1–1.0)
Monocytes Relative: 8 % (ref 3–12)
Neutrophils Relative %: 54 % (ref 43–77)
RBC: 4.12 MIL/uL — ABNORMAL LOW (ref 4.22–5.81)

## 2012-03-24 LAB — COMPREHENSIVE METABOLIC PANEL
ALT: 17 U/L (ref 0–53)
AST: 24 U/L (ref 0–37)
Albumin: 3.6 g/dL (ref 3.5–5.2)
CO2: 30 mEq/L (ref 19–32)
Calcium: 9.6 mg/dL (ref 8.4–10.5)
GFR calc non Af Amer: 41 mL/min — ABNORMAL LOW (ref >90)
Sodium: 139 mEq/L (ref 135–145)

## 2012-03-24 LAB — TSH: TSH: 1.743 u[IU]/mL (ref 0.350–4.500)

## 2012-03-24 LAB — APTT: aPTT: 42 seconds — ABNORMAL HIGH (ref 24–37)

## 2012-03-24 LAB — PROTIME-INR: Prothrombin Time: 22.6 seconds — ABNORMAL HIGH (ref 11.6–15.2)

## 2012-03-24 MED ORDER — WARFARIN - PHYSICIAN DOSING INPATIENT
Freq: Every day | Status: DC
  Administered 2012-03-26: 18:00:00

## 2012-03-24 MED ORDER — SODIUM CHLORIDE 0.45 % IV SOLN: INTRAVENOUS | Status: DC

## 2012-03-24 MED ORDER — SODIUM CHLORIDE 0.9 % IJ SOLN: 3.0000 mL | INTRAMUSCULAR | Status: DC | PRN

## 2012-03-24 MED ORDER — WARFARIN SODIUM 5 MG PO TABS: 5.0000 mg | ORAL_TABLET | Freq: Every evening | ORAL | Status: DC

## 2012-03-24 MED ORDER — ACETAMINOPHEN 325 MG PO TABS: 650.0000 mg | ORAL_TABLET | ORAL | Status: DC | PRN

## 2012-03-24 MED ORDER — ASPIRIN 81 MG PO CHEW
324.0000 mg | CHEWABLE_TABLET | ORAL | Status: DC
  Filled 2012-03-24: qty 4

## 2012-03-24 MED ORDER — WARFARIN SODIUM 5 MG PO TABS
5.0000 mg | ORAL_TABLET | ORAL | Status: DC
  Administered 2012-03-25: 5 mg via ORAL
  Filled 2012-03-24: qty 1

## 2012-03-24 MED ORDER — CHLORHEXIDINE GLUCONATE 4 % EX LIQD
60.0000 mL | Freq: Once | CUTANEOUS | Status: AC
  Administered 2012-03-24: 4 via TOPICAL
  Filled 2012-03-24: qty 60

## 2012-03-24 MED ORDER — SODIUM CHLORIDE 0.9 % IJ SOLN
3.0000 mL | Freq: Two times a day (BID) | INTRAMUSCULAR | Status: DC
  Administered 2012-03-24 – 2012-03-26 (×3): 3 mL via INTRAVENOUS

## 2012-03-24 MED ORDER — CEFAZOLIN SODIUM-DEXTROSE 2-3 GM-% IV SOLR
2.0000 g | INTRAVENOUS | Status: DC
  Filled 2012-03-24 (×2): qty 50

## 2012-03-24 MED ORDER — HYDROXYZINE HCL 10 MG PO TABS
10.0000 mg | ORAL_TABLET | ORAL | Status: DC | PRN
  Administered 2012-03-25 – 2012-03-26 (×2): 10 mg via ORAL
  Filled 2012-03-24 (×3): qty 1

## 2012-03-24 MED ORDER — SODIUM CHLORIDE 0.9 % IV SOLN: 250.0000 mL | INTRAVENOUS | Status: DC | PRN

## 2012-03-24 MED ORDER — CALCIUM CARBONATE-VITAMIN D 500-200 MG-UNIT PO TABS
1.0000 | ORAL_TABLET | Freq: Every evening | ORAL | Status: DC
  Administered 2012-03-24 – 2012-03-26 (×3): 1 via ORAL
  Filled 2012-03-24 (×4): qty 1

## 2012-03-24 MED ORDER — POTASSIUM CHLORIDE CRYS ER 10 MEQ PO TBCR
10.0000 meq | EXTENDED_RELEASE_TABLET | Freq: Two times a day (BID) | ORAL | Status: DC
  Administered 2012-03-24 – 2012-03-27 (×6): 10 meq via ORAL
  Filled 2012-03-24 (×11): qty 1

## 2012-03-24 MED ORDER — LISINOPRIL 5 MG PO TABS
5.0000 mg | ORAL_TABLET | Freq: Every morning | ORAL | Status: DC
  Administered 2012-03-25 – 2012-03-27 (×2): 5 mg via ORAL
  Filled 2012-03-24 (×3): qty 1

## 2012-03-24 MED ORDER — CHLORHEXIDINE GLUCONATE 4 % EX LIQD: 60.0000 mL | Freq: Once | CUTANEOUS | Status: DC

## 2012-03-24 MED ORDER — ONDANSETRON HCL 4 MG/2ML IJ SOLN: 4.0000 mg | Freq: Four times a day (QID) | INTRAMUSCULAR | Status: DC | PRN

## 2012-03-24 MED ORDER — ASPIRIN 300 MG RE SUPP
300.0000 mg | RECTAL | Status: DC
  Filled 2012-03-24: qty 1

## 2012-03-24 MED ORDER — WARFARIN SODIUM 7.5 MG PO TABS
7.5000 mg | ORAL_TABLET | ORAL | Status: DC
  Administered 2012-03-24 – 2012-03-26 (×2): 7.5 mg via ORAL
  Filled 2012-03-24 (×3): qty 1

## 2012-03-24 MED ORDER — FUROSEMIDE 80 MG PO TABS
80.0000 mg | ORAL_TABLET | Freq: Every morning | ORAL | Status: DC
Start: 2012-03-25 — End: 2012-03-27
  Administered 2012-03-25 – 2012-03-27 (×2): 80 mg via ORAL
  Filled 2012-03-24 (×3): qty 1

## 2012-03-24 MED ORDER — SODIUM CHLORIDE 0.9 % IR SOLN
80.0000 mg | Status: DC
  Filled 2012-03-24 (×2): qty 2

## 2012-03-24 MED ORDER — SODIUM CHLORIDE 0.9 % IV SOLN: INTRAVENOUS | Status: DC

## 2012-03-24 NOTE — H&P (Signed)
Spencer Ramirez    Date of visit:  03/24/2012 DOB:  Jan 23, 1931    Age:  79 yrs. Medical record number:  [id number]     Account number:  [id number] Primary Care Provider: Windle Guard  CURRENT DIAGNOSES  1. Arrhythmia-Sick Sinus Syndrome  2. Congestive Heart Failure Chronic Systolic  3. Cardiomyopathy Non-Obstructive  4. Syncope  5. Arrhythmia-Atrial Flutter  6. Chronic venous insufficiency  7. Chronic Kidney Disease (Stage 3)  8. Long Term Use Anticoagulant  9. Dementia  ALLERGIES  NKDA  MEDICATIONS  1. lisinopril 10 mg tablet, 1/2 tab daily  2. hydroxyzine HCl 10 mg tablet, PRN  3. Tylenol Ex Str Rapid Release 500 mg tablet, PRN  4. carvedilol 6.25 mg tablet, BID  5. multivitamin tablet, 1 p.o. daily  6. triamcinolone acetonide 0.5 % cream, PRN  7. furosemide 40 mg tablet, 2 qam  8. Klor-Con M20 20 mEq tablet,ER particles/crystals, 1/2 tab b.i.d.  9. Calcium 600 600 mg (1,500 mg) tablet, 1 p.o. daily  10. warfarin 5 mg tablet, 1 1/2 tabs qd or as directed  HISTORY OF PRESENT ILLNESS  Patient admitted to the hospital for evaluation of recent syncope and seizure as well as an 8 second pause noted on telemetry monitoring. The patient has a history of some dementia and was found to be in atrial flutter. He also has stage III chronic kidney disease. He was anticoagulated with warfarin and eventually underwent cardioversion in August from atrial flutter to sinus rhythm. He had a Cardiolite scan done in May showed an ejection fraction of 33% with a large inferolateral infarction. Because of his dementia we opted to treat him medically and he has been treated medically since then. He has remained in sinus rhythm. He does have a moderate amount of dyspnea. He had a seizure while sitting at a table that was witnessed by his daughter, however he shortly became responsive after that and she later took him home and called Dr. Jeannetta Nap. Patient was scheduled to have an MRI and also was supposed to have  had a sleep deprived EKG last night. I saw him yesterday in the office for a routine examination and because of the history of a seizure was worried that he might have had an arrhythmia. I placed an event monitor on him and he was noted to have an 8 second pause that occurred this morning. Because of the long pause and recent symptoms I recommended that he be admitted to the hospital for further observation and possible pacemaker placement. The patient has moderate dyspnea on exertion. He does not have PND, orthopnea. He has a mild amount of edema. He does have some intermittent memory loss and has family that stays with him.  PAST HISTORY  Past Medical Illnesses:  chronic kidney disease Stage 3, cholelithiasis, chronic venous insufficiency;  Cardiovascular Illnesses:  atrial flutter, cardiomyopathy(idiopathic);  Surgical Procedures:  inguinal herniorrhaphy-left;  Cardiology Procedures-Invasive:  cardioversion August 2013;  Cardiology Procedures-Noninvasive:  echocardiogram May 2013, lexiscan cardiolite May 2013, event monitor February 2014;  LVEF of 33% documented via nuclear study on 07/02/2011  CHADS Score:  2  CHA2DS2-VASC Score:  3  CARDIO-PULMONARY TEST DATES EKG Date:  12/31/2011;  Nuclear Study Date:  07/02/2011;  Echocardiography Date: 07/01/2011;  Chest Xray Date: 05/04/2011;    SOCIAL HISTORY Alcohol Use:  does not use alcohol;  Smoking:  nonsmoker;  Diet:  regular diet;  Lifestyle:  widower;  Exercise:  no regular exercise;  Occupation:  Patent examiner, retired and  [employer];  Residence:  lives alone, has grandaughter that checks on him, daughter lives near [place] and 2 sons;   _ REVIEW OF SYSTEMS General:  mild malaise and fatigue  Eyes:  wears eye glasses/contact lenses  Respiratory:  denies dyspnea, cough, wheezing or hemoptysis. Cardiovascular:  please review HPI  Abdominal:  denies dyspepsia, GI bleeding, constipation, or diarrhea  Genitourinary-Male:  frequency, nocturia   Musculoskeletal:  Varicose veins Neurological:  recent seizure, memory loss Other than as noted above, the remainder of the review of systems is unremarkable  PHYSICAL EXAMINATION VITAL SIGNS  Blood Pressure:  111/77   Pulse:  60/min. Respirations:  12/min. Weight:  194.00 lbs. Height:  70"BMI: 28  Constitutional:  pleasant white male in no acute distress Skin:  warm and dry to touch, no apparent skin lesions, or masses noted. Head:  normocephalic, normal hair pattern, no masses or tenderness Eyes:  EOMS Intact, PERRLA, C and S clear, Funduscopic exam not done. ENT:  ears, nose and throat reveal no gross abnormalities.  Dentition good. Neck:  supple, without massess. No JVD, thyromegaly or carotid bruits. Carotid upstroke normal. Chest:  normal symmetry, clear to auscultation and percussion. Cardiac:  irregular rhythm, normal S1 and S2, No S3 or S4, no murmurs, gallops or rubs detected. Abdomen:  abdomen soft,non-tender, no masses, no hepatospenomegaly, or aneurysm noted Peripheral Pulses:  the femoral,dorsalis pedis, and posterior tibial pulses are full and equal bilaterally with no bruits auscultated. Extremities & Back:  trace edema Neurological:  not oriented to date, mildly abnormal gait  IMPRESSIONS/PLAN  1. Recent seizure/syncope with documented 8 second pause on event monitoring 2. Sick sinus syndrome 3. History of atrial flutter with previous cardioversion in August 4. Chronic systolic congestive heart failure 5. Mixed picture ischemic cardiomyopathy 6. Long-term anticoagulation with warfarin 7. Chronic kidney disease stage 3-4 8. Dementia  Recommendations:  He is admitted to telemetry. He will have electrophysiology consult. His carvedilol will be held. Concern is whether he would need to have a biventricular pacemaker implanted or not.  Cardiology Physician:  [name] MD [facility]

## 2012-03-24 NOTE — Progress Notes (Signed)
Per Dr. Johney Frame, ok to give tonights dose of coumadin with today's INR 2.09. Levonne Spiller, RN

## 2012-03-24 NOTE — Progress Notes (Signed)
A copy of pt Healthcare Power of Attorney placed in patient chart per MD request. Levonne Spiller, RN

## 2012-03-24 NOTE — Progress Notes (Signed)
Pt arrived to floor with granddaughters (caretaker). VSS. Dr. Donnie Aho notified of patient admission. Pt and family aware of fall precautions/interventions. Pt family request that bed alarm stay off while they are in the room. Will continue to monitor closely. Levonne Spiller, Rn

## 2012-03-24 NOTE — Consult Note (Signed)
ELECTROPHYSIOLOGY CONSULT NOTE     Primary Care Physician: Kaleen Mask, MD Referring Physician:  Dr Donnie Aho  Admit Date: 03/24/2012  Reason for consultation:  bradycardia  JULUIS FITZSIMMONS is a 79 y.o. male with a h/o mild dementia, ischemic CM (EF 33%), recent syncope and seizure as well as an 8 second pause noted on telemetry monitoring. He recently presented to Dr Jeannetta Nap complaining of seizure like activity.  He was observed to have about 15 seconds of unresponsiveness.  Per his family, an MRI was unrevealing (age appropriate atrophy) and therefore an event monitor was placed.  He was observed today to have an 8 second sinus pause.  Though he does not recall symptoms, the patient is not a great historian due to dementia.  His family reports that he frequently "falls alseep" in his chair and wonders if potentially he may have had more episodes of syncope.  Today, he denies symptoms of palpitations, chest pain,orthopnea, PND, lower extremity edema, dizziness, presyncope, syncope, or neurologic sequela.  He has chronic stable dyspnea due to CHF. The patient is tolerating medications without difficulties and is otherwise without complaint today.   Past Medical History  Diagnosis Date  . CHF (congestive heart failure)   . Cardiomyopathy   . Dysrhythmia     atrial flutter, atrial fibrillation  . Peripheral vascular disease     chronic venous insufficiency  . Chronic kidney disease     Stg 3  . Anticoagulant long-term use   . Dementia   . Shortness of breath   . Sleep apnea    Past Surgical History  Procedure Date  . Tonsillectomy   . Cardioversion 09/17/2011    Procedure: CARDIOVERSION;  Surgeon: Othella Boyer, MD;  Location: [facility] ENDOSCOPY;  Service: Cardiovascular;  Laterality: N/A;  anesth start time 1005       . aspirin  324 mg Oral NOW   Or  . aspirin  300 mg Rectal NOW  . calcium-vitamin D  1 tablet Oral QPM  . furosemide  80 mg Oral q morning - 10a  . lisinopril  5  mg Oral q morning - 10a  . potassium chloride SA  10 mEq Oral BID  . sodium chloride  3 mL Intravenous Q12H  . warfarin  5-7.5 mg Oral QPM      No Known Allergies  History   Social History  . Marital Status: Widowed    Spouse Name: N/A    Number of Children: N/A  . Years of Education: N/A   Occupational History  . Not on file.   Social History Main Topics  . Smoking status: Never Smoker   . Smokeless tobacco: Not on file  . Alcohol Use: 0.6 oz/week    1 Glasses of wine per week     Comment: once a week  . Drug Use: No  . Sexually Active:    Other Topics Concern  . Not on file   Social History Narrative  . No narrative on file    Pt is unaware of any significant PFH  ROS- All systems are reviewed and negative except as per the HPI above  Physical Exam: Telemetry: Filed Vitals:   03/24/12 1253  BP: 111/77  Pulse: 59  Temp: 97.3 F (36.3 C)  TempSrc: Oral  Resp: 18  Height: 5\' 6"  (1.676 m)  Weight: 179 lb (81.194 kg)  SpO2: 98%    GEN- The patient is elderly appearing, alert  Head- normocephalic, atraumatic Eyes-  Sclera clear,  conjunctiva pink Ears- hearing intact Oropharynx- clear Neck- supple,  Lungs- Clear to ausculation bilaterally, normal work of breathing Heart- Regular rate and rhythm, no murmurs, rubs or gallops, PMI not laterally displaced GI- soft, NT, ND, + BS Extremities- no clubbing, cyanosis, or edema MS- no significant deformity or atrophy Skin- no rash or lesion Psych- euthymic mood, full affect Neuro- strength and sensation are intact  EKG sinus rhythm 58 bpm, PR , QRS 110 msec, QT 461 msec  Labs:   Lab Results  Component Value Date   WBC 8.3 03/24/2012   HGB 13.5 03/24/2012   HCT 39.3 03/24/2012   MCV 95.4 03/24/2012   PLT 195 03/24/2012   No results found for this basename: NA,K,CL,CO2,BUN,CREATININE,CALCIUM,LABALBU,PROT,BILITOT,ALKPHOS,ALT,AST,GLUCOSE in the last 168 hours Lab Results  Component Value Date   CKTOTAL  143 09/06/2007   CKMB 3.0 09/06/2007   TROPONINI  Value: 0.05        NO INDICATION OF MYOCARDIAL INJURY. 09/06/2007    ASSESSMENT AND PLAN:   1. Syncope/ bradycardia The patient has had recent abrupt loss of consciousness.  An event monitor has documented pauses while awake of > 4 seconds.  He is on coreg but per Dr Donnie Aho will require beta blockers long term due to ischemic CM, CAD, and CHF.   I would therefore recommend pacemaker implantation at this time.  Risks, benefits, alternatives to pacemaker implantation were discussed in detail with the patient today. The patient understands that the risks include but are not limited to bleeding, infection, pneumothorax, perforation, tamponade, vascular damage, renal failure, MI, stroke, death,  and lead dislodgement and wishes to proceed. We will therefore schedule the procedure with Dr Ladona Ridgel tomorrow.    Hillis Range, MD 03/24/2012  3:53 PM

## 2012-03-25 ENCOUNTER — Encounter (HOSPITAL_COMMUNITY)
Admission: AD | Disposition: A | Discharge status: Home / Self Care | Payer: Self-pay | Source: Ambulatory Visit | Attending: Cardiology

## 2012-03-25 DIAGNOSIS — I498 Other specified cardiac arrhythmias: Secondary | ICD-10-CM

## 2012-03-25 HISTORY — PX: PACEMAKER INSERTION: SHX728

## 2012-03-25 HISTORY — PX: PERMANENT PACEMAKER INSERTION: SHX5480

## 2012-03-25 LAB — PROTIME-INR
INR: 2.14 — ABNORMAL HIGH (ref 0.00–1.49)
Prothrombin Time: 23 seconds — ABNORMAL HIGH (ref 11.6–15.2)

## 2012-03-25 LAB — LIPID PANEL
LDL Cholesterol: 123 mg/dL — ABNORMAL HIGH (ref 0–99)
VLDL: 13 mg/dL (ref 0–40)

## 2012-03-25 SURGERY — PERMANENT PACEMAKER INSERTION
Anesthesia: LOCAL

## 2012-03-25 MED ORDER — MIDAZOLAM HCL 2 MG/2ML IJ SOLN
INTRAMUSCULAR | Status: AC
  Filled 2012-03-25: qty 2

## 2012-03-25 MED ORDER — ACETAMINOPHEN 325 MG PO TABS: 325.0000 mg | ORAL_TABLET | ORAL | Status: DC | PRN

## 2012-03-25 MED ORDER — CEFAZOLIN SODIUM-DEXTROSE 2-3 GM-% IV SOLR
2.0000 g | Freq: Four times a day (QID) | INTRAVENOUS | Status: AC
  Administered 2012-03-25 – 2012-03-26 (×3): 2 g via INTRAVENOUS
  Filled 2012-03-25 (×4): qty 50

## 2012-03-25 MED ORDER — FENTANYL CITRATE 0.05 MG/ML IJ SOLN
INTRAMUSCULAR | Status: AC
  Filled 2012-03-25: qty 4

## 2012-03-25 MED ORDER — HEPARIN (PORCINE) IN NACL 2-0.9 UNIT/ML-% IJ SOLN
INTRAMUSCULAR | Status: AC
  Filled 2012-03-25: qty 500

## 2012-03-25 MED ORDER — LIDOCAINE HCL (PF) 1 % IJ SOLN
INTRAMUSCULAR | Status: AC
  Filled 2012-03-25: qty 60

## 2012-03-25 MED ORDER — ONDANSETRON HCL 4 MG/2ML IJ SOLN: 4.0000 mg | Freq: Four times a day (QID) | INTRAMUSCULAR | Status: DC | PRN

## 2012-03-25 NOTE — Progress Notes (Signed)
  Echocardiogram 2D Echocardiogram has been performed.  Cathie Beams 03/25/2012, 11:27 AM

## 2012-03-25 NOTE — Progress Notes (Signed)
   ELECTROPHYSIOLOGY ROUNDING NOTE    Patient Name: Spencer Ramirez Date of Encounter: 03-25-2012    SUBJECTIVE:Patient feels well.  No chest pain or shortness of breath.  No dizziness.  Pending pacemaker implant today for syncope with documented sinus pauses (8 seconds).  TELEMETRY: Reviewed telemetry pt in sinus bradycardia with 1st degree AV block Filed Vitals:   03/24/12 1253 03/24/12 2121 03/25/12 0529  BP: 111/77 129/80 99/63  Pulse: 59 68 68  Temp: 97.3 F (36.3 C) 98 F (36.7 C) 98 F (36.7 C)  TempSrc: Oral Oral Oral  Resp: 18 17 16  Height: 5' 6" (1.676 m)    Weight: 179 lb (81.194 kg)  170 lb 6.4 oz (77.293 kg)  SpO2: 98% 98% 94%    Physical Exam   Well appearing elderly man, NAD HEENT: Unremarkable Neck:  No JVD, no thyromegally Lungs:  Clear with no wheezes HEART:  Regular rate rhythm, no murmurs, no rubs, no clicks Abd:  Flat, positive bowel sounds, no organomegally, no rebound, no guarding Ext:  2 plus pulses, no edema, no cyanosis, no clubbing Skin:  No rashes no nodules Neuro:  CN II through XII intact, motor grossly intact  LABS: Basic Metabolic Panel:  Basename 03/24/12 1503  NA 139  K 4.0  CL 100  CO2 30  GLUCOSE 86  BUN 33*  CREATININE 1.54*  CALCIUM 9.6  MG --  PHOS --   Liver Function Tests:  Basename 03/24/12 1503  AST 24  ALT 17  ALKPHOS 90  BILITOT 0.8  PROT 7.1  ALBUMIN 3.6  CBC:  Basename 03/24/12 1503  WBC 8.3  NEUTROABS 4.5  HGB 13.5  HCT 39.3  MCV 95.4  PLT 195   Thyroid Function Tests:  Basename 03/24/12 1503  TSH 1.743  T4TOTAL --  T3FREE --  THYROIDAB --    Radiology/Studies:  Dg Chest 2 View 03/24/2012  *RADIOLOGY REPORT*  Clinical Data: Preoperative examination (pacemaker insertion)  CHEST - 2 VIEW  Comparison: 09/06/2007; 09/13/2001; chest CT - 09/06/2007  Findings: Grossly unchanged borderline enlarged cardiac silhouette and mediastinal contours with mild tortuosity of the thoracic aorta.  No focal  airspace opacities.  No definite pleural effusion or pneumothorax.  Unchanged bones.  IMPRESSION: No acute cardiopulmonary disease.   Original Report Authenticated By: John Watts V, M  Tele - sinus bradycardia  A/P  1. Symptomatic bradycardia with 8 seconds pauses 2. HTN 3. CAD 4. Diastolic CHF Rec: I have recommended proceeding with PPM. Question is whether a BiV PPM is appropriate. I suspect he will not need a biV PPM but will plan on assessing his AV conduction and consider BiV PPM if A pacing results in a PR interval over 300 ms (currently NSR with a PR of 250).  Yoshiye Kraft,M.D.     

## 2012-03-25 NOTE — H&P (View-Only) (Signed)
   ELECTROPHYSIOLOGY ROUNDING NOTE    Patient Name: Spencer Ramirez Date of Encounter: 03-25-2012    SUBJECTIVE:Patient feels well.  No chest pain or shortness of breath.  No dizziness.  Pending pacemaker implant today for syncope with documented sinus pauses (8 seconds).  TELEMETRY: Reviewed telemetry pt in sinus bradycardia with 1st degree AV block Filed Vitals:   03/24/12 1253 03/24/12 2121 03/25/12 0529  BP: 111/77 129/80 99/63  Pulse: 59 68 68  Temp: 97.3 F (36.3 C) 98 F (36.7 C) 98 F (36.7 C)  TempSrc: Oral Oral Oral  Resp: 18 17 16   Height: 5\' 6"  (1.676 m)    Weight: 179 lb (81.194 kg)  170 lb 6.4 oz (77.293 kg)  SpO2: 98% 98% 94%    Physical Exam   Well appearing elderly man, NAD HEENT: Unremarkable Neck:  No JVD, no thyromegally Lungs:  Clear with no wheezes HEART:  Regular rate rhythm, no murmurs, no rubs, no clicks Abd:  Flat, positive bowel sounds, no organomegally, no rebound, no guarding Ext:  2 plus pulses, no edema, no cyanosis, no clubbing Skin:  No rashes no nodules Neuro:  CN II through XII intact, motor grossly intact  LABS: Basic Metabolic Panel:  Basename 03/24/12 1503  NA 139  K 4.0  CL 100  CO2 30  GLUCOSE 86  BUN 33*  CREATININE 1.54*  CALCIUM 9.6  MG --  PHOS --   Liver Function Tests:  Basename 03/24/12 1503  AST 24  ALT 17  ALKPHOS 90  BILITOT 0.8  PROT 7.1  ALBUMIN 3.6  CBC:  Basename 03/24/12 1503  WBC 8.3  NEUTROABS 4.5  HGB 13.5  HCT 39.3  MCV 95.4  PLT 195   Thyroid Function Tests:  Basename 03/24/12 1503  TSH 1.743  T4TOTAL --  T3FREE --  THYROIDAB --    Radiology/Studies:  Dg Chest 2 View 03/24/2012  *RADIOLOGY REPORT*  Clinical Data: Preoperative examination (pacemaker insertion)  CHEST - 2 VIEW  Comparison: 09/06/2007; 09/13/2001; chest CT - 09/06/2007  Findings: Grossly unchanged borderline enlarged cardiac silhouette and mediastinal contours with mild tortuosity of the thoracic aorta.  No focal  airspace opacities.  No definite pleural effusion or pneumothorax.  Unchanged bones.  IMPRESSION: No acute cardiopulmonary disease.   Original Report Authenticated By: Lars Pinks  Tele - sinus bradycardia  A/P  1. Symptomatic bradycardia with 8 seconds pauses 2. HTN 3. CAD 4. Diastolic CHF Rec: I have recommended proceeding with PPM. Question is whether a BiV PPM is appropriate. I suspect he will not need a biV PPM but will plan on assessing his AV conduction and consider BiV PPM if A pacing results in a PR interval over 300 ms (currently NSR with a PR of 250).  [name].D.

## 2012-03-25 NOTE — Op Note (Signed)
DDD PPM insertion via the left subclavian vein without immediate complication. [id number].

## 2012-03-25 NOTE — Interval H&P Note (Signed)
History and Physical Interval Note:  03/25/2012 10:03 AM  Spencer Ramirez  has presented today for surgery, with the diagnosis of Heart block  The various methods of treatment have been discussed with the patient and family. After consideration of risks, benefits and other options for treatment, the patient has consented to  Procedure(s) (LRB) with comments: PERMANENT PACEMAKER INSERTION (N/A) as a surgical intervention .  The patient's history has been reviewed, patient examined, no change in status, stable for surgery.  I have reviewed the patient's chart and labs.  Questions were answered to the patient's satisfaction.     [name].D.

## 2012-03-25 NOTE — Op Note (Signed)
NAMEHARLESS, MOLINARI NO.:  [id number]  MEDICAL RECORD NO.:  [id number]  LOCATION:  [id number]                        FACILITY:  [facility]  PHYSICIAN:  Doylene Canning. Ladona Ridgel, MD    DATE OF BIRTH:  1931/01/09  DATE OF PROCEDURE:  03/25/2012 DATE OF DISCHARGE:                              OPERATIVE REPORT   PROCEDURE PERFORMED:  Insertion of a dual-chamber pacemaker.  INDICATION:  Symptomatic bradycardia with pauses up to 8 seconds.  INTRODUCTION:  The patient is a 79 year old man who has a history of coronary artery disease, also history of cardiomyopathy, also with a history of dementia who was admitted to the hospital with documented prolonged pauses in the setting of "seizure-like activity."  He was found on cardiac monitoring to have an 8-second pause and is admitted for additional evaluation and insertion of a dual-chamber pacemaker as he was on no AV nodal or sinus nodal blocking drugs.  PROCEDURE:  After informed consent was obtained, the patient was taken to the diagnostic EP lab in a fasting state.  After usual preparation and draping, intravenous fentanyl and midazolam was given for sedation. 30 mL of lidocaine was infiltrated into the left infraclavicular region. A 5-cm incision was carried out over this region.  Electrocautery was utilized to dissect down to the fascial plane.  The left subclavian vein was punctured x2 and the Medtronic model 5076 52-cm active fixation pacing lead, serial number [id number] was advanced into the right ventricle and the Medtronic model 5076, 45 cm active fixation pacing lead, serial number [id number] was advanced to the right atrium. Mapping was carried out in the right ventricle.  At the final site, the R-waves measured 14, the pacing impedance was 700 ohms, the threshold 0.3 volts at 0.5 milliseconds.  10 V pacing did not stimulate the diaphragm and with active fixation of the lead, there was a large current of injury.  With  the ventricular leads in satisfactory position, attention was then turned to placement of atrial lead, was placed in anterolateral portion of the right atrium where the P-waves measured 5 mV.  The pacing impedance was 500 ohms and threshold 0.6 V at 0.5 milliseconds.  Again, 10 V pacing not stimulate the diaphragm and with active fixation of the lead, there was a good current of injury.  With these satisfactory parameters, the leads were secured to the subpectoral fascia with figure-of-eight silk suture.  Sewing sleeve was secured with silk suture.  Electrocautery was utilized to make a subcutaneous pocket. Antibiotic irrigation was utilized to irrigate the pocket.  The Medtronic Adapta L dual-chamber pacemaker, serial number [id number] was connected to the atrial and ventricular leads and placed back in the subcutaneous pocket where it was secured with silk suture.  The pocket was again irrigated with antibiotic irrigation.  The incision closed with 2-0 and 3-0 Vicryl.  Benzoin and Steri-Strips were painted on the skin, pressure dressing was applied, and the patient was returned to his room in satisfactory condition.  COMPLICATIONS:  There were no immediate procedure complications.  RESULTS:  This demonstrates successful implantation of a Medtronic dual- chamber pacemaker in a patient with symptomatic bradycardia.  Doylene Canning. Ladona Ridgel, MD     GWT/MEDQ  D:  03/25/2012  T:  03/25/2012  Job:  [id number]

## 2012-03-26 ENCOUNTER — Inpatient Hospital Stay (HOSPITAL_COMMUNITY): Payer: Medicare Other

## 2012-03-26 DIAGNOSIS — I495 Sick sinus syndrome: Principal | ICD-10-CM

## 2012-03-26 MED ORDER — SODIUM CHLORIDE 0.9 % IV SOLN
INTRAVENOUS | Status: AC
  Administered 2012-03-26: 10:00:00 via INTRAVENOUS

## 2012-03-26 NOTE — Progress Notes (Signed)
Patient ID: Spencer Ramirez, male   DOB: 11/18/1930, 79 y.o.   MRN: [id number] Subjective:  No chest pain or sob  Objective:  Vital Signs in the last 24 hours: Temp:  [97.3 F (36.3 C)-98.1 F (36.7 C)] 98.1 F (36.7 C) (02/08 0545) Pulse Rate:  [60-73] 72 (02/08 0545) Resp:  [15-16] 16 (02/08 0545) BP: (88-113)/(54-68) 91/62 mmHg (02/08 0545) SpO2:  [94 %-97 %] 97 % (02/08 0545) Weight:  [169 lb 12.8 oz (77.021 kg)] 169 lb 12.8 oz (77.021 kg) (02/08 0545)  Intake/Output from previous day: 02/07 0701 - 02/08 0700 In: 43 [I.V.:43] Out: -  Intake/Output from this shift:    Physical Exam: Well appearing NAD HEENT: Unremarkable Neck:  No JVD, no thyromegally Lungs:  Clear with no wheezes, no hematoma HEART:  Regular rate rhythm, no murmurs, no rubs, no clicks Abd:  soft, positive bowel sounds, no organomegally, no rebound, no guarding Ext:  2 plus pulses, no edema, no cyanosis, no clubbing Skin:  No rashes no nodules Neuro:  CN II through XII intact, motor grossly intact  Lab Results:  Recent Labs  03/24/12 1503  WBC 8.3  HGB 13.5  PLT 195    Recent Labs  03/24/12 1503  NA 139  K 4.0  CL 100  CO2 30  GLUCOSE 86  BUN 33*  CREATININE 1.54*   No results found for this basename: TROPONINI, CK, MB,  in the last 72 hours Hepatic Function Panel  Recent Labs  03/24/12 1503  PROT 7.1  ALBUMIN 3.6  AST 24  ALT 17  ALKPHOS 90  BILITOT 0.8    Recent Labs  03/25/12 0630  CHOL 184   No results found for this basename: PROTIME,  in the last 72 hours  Imaging: Dg Chest 2 View  03/24/2012  *RADIOLOGY REPORT*  Clinical Data: Preoperative examination (pacemaker insertion)  CHEST - 2 VIEW  Comparison: 09/06/2007; 09/13/2001; chest CT - 09/06/2007  Findings: Grossly unchanged borderline enlarged cardiac silhouette and mediastinal contours with mild tortuosity of the thoracic aorta.  No focal airspace opacities.  No definite pleural effusion or pneumothorax.  Unchanged  bones.  IMPRESSION: No acute cardiopulmonary disease.   Original Report Authenticated By: Tacey Ruiz, MD     Cardiac Studies: Tele - nsr Assessment/Plan:  1. Symptomatic bradycardia 2. S/p PPM 3. Hypotension Rec: etiology of hypotension unclear. Patient is asymptomatic. Will give a fluid challenge, hold his morning meds, and check a CXR.PPM interogation demonstrates normal function.  LOS: 2 days    Gregg Taylor,M.D. 03/26/2012, 10:11 AM

## 2012-03-27 ENCOUNTER — Encounter (HOSPITAL_COMMUNITY): Payer: Self-pay | Admitting: Physician Assistant

## 2012-03-27 DIAGNOSIS — Z950 Presence of cardiac pacemaker: Secondary | ICD-10-CM

## 2012-03-27 DIAGNOSIS — I951 Orthostatic hypotension: Secondary | ICD-10-CM

## 2012-03-27 LAB — PROTIME-INR: Prothrombin Time: 25.8 seconds — ABNORMAL HIGH (ref 11.6–15.2)

## 2012-03-27 NOTE — Progress Notes (Signed)
Patient ID: Spencer Ramirez, male   DOB: [birth year], 80 y.o.   MRN: [id number] Subjective:  No chest pain or sob. Etiology of hypotension unclear. He is back to normal after IV hydration. No symptoms whatsoever.  Objective:  Vital Signs in the last 24 hours: Temp:  [98.1 F (36.7 C)-98.6 F (37 C)] 98.2 F (36.8 C) (02/09 0600) Pulse Rate:  [70-79] 79 (02/09 0600) Resp:  [16-18] 18 (02/09 0600) BP: (88-115)/(51-67) 112/60 mmHg (02/09 0600) SpO2:  [95 %-98 %] 97 % (02/09 0600)  Intake/Output from previous day: 02/08 0701 - 02/09 0700 In: 1080 [P.O.:1080] Out: -  Intake/Output from this shift:    Physical Exam: Well appearing elderly man, NAD HEENT: Unremarkable Neck:  7 cm JVD, no thyromegally Lungs:  Clear with no wheezes, rales, or rhonchi HEART:  Regular rate rhythm, no murmurs, no rubs, no clicks Abd:  Flat, positive bowel sounds, no organomegally, no rebound, no guarding Ext:  2 plus pulses, no edema, no cyanosis, no clubbing Skin:  No rashes no nodules Neuro:  CN II through XII intact, motor grossly intact  Lab Results:  Recent Labs  03/24/12 1503  WBC 8.3  HGB 13.5  PLT 195    Recent Labs  03/24/12 1503  NA 139  K 4.0  CL 100  CO2 30  GLUCOSE 86  BUN 33*  CREATININE 1.54*   No results found for this basename: TROPONINI, CK, MB,  in the last 72 hours Hepatic Function Panel  Recent Labs  03/24/12 1503  PROT 7.1  ALBUMIN 3.6  AST 24  ALT 17  ALKPHOS 90  BILITOT 0.8    Recent Labs  03/25/12 0630  CHOL 184   No results found for this basename: PROTIME,  in the last 72 hours  Imaging: Dg Chest 2 View  03/26/2012  *RADIOLOGY REPORT*  Clinical Data: Post pacemaker insertion  CHEST - 2 VIEW  Comparison: Chest x-ray of 03/24/2012  Findings: A dual lead permanent pacemaker is now present.  No pneumothorax is seen.  The lungs are clear.  Cardiomegaly is stable.  No acute skeletal abnormality is seen.  IMPRESSION: Permanent pacemaker now present.  No  active lung disease.  Stable cardiomegaly.   Original Report Authenticated By: Dwyane Dee, M.D.     Cardiac Studies: Tele - nsr Assessment/Plan:  1. Symptomatic bradycardia 2. Transient hypotension 3. S/p PPM Rec: ok to discharge home today. Usual followup.   LOS: 3 days    Mauricia Mertens,M.D. 03/27/2012, 8:19 AM

## 2012-03-27 NOTE — Progress Notes (Signed)
Pt d/c home with family.

## 2012-03-27 NOTE — Discharge Summary (Signed)
Discharge Summary   Patient ID: Spencer Ramirez,  MRN: [id number], DOB/AGE: Oct 15, 1930 79 y.o.  Admit date: 03/24/2012 Discharge date: 03/27/2012  Primary Physician: Kaleen Mask, MD Primary Cardiologist: Ellwood Handler, MD Primary Electrophysiologist: Lewayne Bunting, MD   Discharge Diagnoses Principal Problem:   Sick sinus syndrome  - S/P placement of cardiac pacemaker  - Medtronic Bi-V PPM Active Problems:   Syncope secondary to SSS     Orthostatic hypotension  - Patient developed asymptomatic hypotension post-operatively  - Fluid challenge given with improvement  - Coreg held, to be resumed as directed by Dr. Donnie Aho   Atrial flutter   Long-term (current) use of anticoagulants  - To resume Coumadin regimen and previous follow-up   Chronic systolic CHF (congestive heart failure)     Allergies No Known Allergies  Diagnostic Studies/Procedures  PA/LATERAL CHEST X-RAY - 03/24/12  IMPRESSION:  No acute cardiopulmonary disease.  TRANSTHORACIC ECHOCARDIOGRAM - 03/25/12  - Left ventricle: The cavity size was normal. Systolic function was moderately to severely reduced. The estimated ejection fraction was in the range of 30% to 35%. Akinesis of the entireinferolateral and inferior myocardium. - Aortic valve: Mild regurgitation. - Mitral valve: Mild regurgitation. - Left atrium: The atrium was moderately dilated.  DUAL-CHAMBER PACEMAKER INSERTION - 03/25/12  RESULTS: This demonstrates successful implantation of a Medtronic dual-chamber pacemaker in a patient with symptomatic bradycardia. See op note for full details.   PA/LATERAL CHEST X-RAY - 03/26/12  IMPRESSION:  Permanent pacemaker now present. No active lung disease. Stable cardiomegaly.   History of Present Illness  Spencer Ramirez is a 79 y.o. male who was admitted to [facility] on 03/24/12 to undergo placement of a dual-chamber PPM for SSS with resultant syncope. He was admitted to [facility] by Dr. Donnie Aho  on 03/24/12. He experienced a syncopal episode and seizure as well as an 8 second pause noted on telemetry monitoring. He is history of dementia, atrial flutter on chronic Coumadin anticoagulation and prior DCCV, CKD stage III. His prior Cardiolite scan done in May of 2013 revealing an EF of 33% and large inferolateral infarct. History of medically for this given his underlying dementia. He reportedly had a seizure while sitting at a table which was witnessed by his daughter. He lost consciousness but shortly became responsive after the episode. He followed up with his PCP and Dr. Donnie Aho in the office for routine examination. In light of his recent syncopal episode and a monitor was placed which revealed an 8 second pause the morning of admission. Given his recent history and documented sick sinus syndrome, he was admitted to the hospital for further observation and possible pacemaker placement.   Hospital Course   EP was consulted and the decision was made to pursue implantation of a dual-chamber pacemaker. Of note, Coreg was held but per Dr. Tresa Endo the patient will require long-term beta blocker use due to his ischemic cardiomyopathy, CAD and CHF. He remained on his outpatient Coumadin regimen. Chest x-ray on admission, as above, revealed no active cardiopulmonary process. A 2-D echocardiogram was obtained revealing EF 30-35% inferolateral and inferior akinesis, moderate LA dilatation, mild AI and mild MR. Subsequently informed, consented and prepped for the procedure which as above results when the successful placement of a Medtronic dual-chamber pacemaker. There were no immediate complications. He did develop postoperative hypotension. He was asymptomatic with this. A post-op CXR revealed no evidence of pneumothorax or other complication. He was given a fluid challenge, morning antihypertensives were held, and he  was observed for an additional night. His blood pressure improved the following morning. Renal  function remained stable. TSH returned WNL. He was evaluated by Dr. Ladona Ridgel to be stable for discharge. He will resume all outpatient medications aside from Coreg given hypotension. This will be resumed on follow-up as directed by his primary cardiologist. Appointments have been made for wound check and an EP follow-up as noted below. This information, including post device activity restrictions and instructions, has been clearly outlined in the discharge AVS.   Discharge Vitals:  Blood pressure 112/60, pulse 79, temperature 98.2 F (36.8 C), temperature source Oral, resp. rate 18, height 5\' 6"  (1.676 m), weight 77.021 kg (169 lb 12.8 oz), SpO2 97.00%.   Labs: Recent Labs     03/24/12  1503  WBC  8.3  HGB  13.5  HCT  39.3  MCV  95.4  PLT  195    Recent Labs Lab 03/24/12 1503  NA 139  K 4.0  CL 100  CO2 30  BUN 33*  CREATININE 1.54*  CALCIUM 9.6  PROT 7.1  BILITOT 0.8  ALKPHOS 90  ALT 17  AST 24  GLUCOSE 86   Recent Labs     03/25/12  0630  CHOL  184  HDL  48  LDLCALC  123*  TRIG  63  CHOLHDL  3.8    Recent Labs  03/24/12 1503  TSH 1.743    Disposition:  Discharge Orders   Future Appointments Provider Department Dept Phone   04/04/2012 10:00 AM [facility] [employer] Main Office [place]) [phone]   06/28/2012 10:45 AM Marinus Maw, MD [facility] Main Office [place]) [phone]   Future Orders Complete By Expires     Diet - low sodium heart healthy  As directed     Increase activity slowly  As directed           Follow-up Information   Schedule an appointment as soon as possible for a visit with TILLEY JR,W SPENCER, MD. (In 1-2 weeks for post-hospital follow-up. )    Contact information:   [address] [place] [address] [phone]       Follow up with [facility] On 04/04/2012. (At 10:00 AM for pacemaker insertion site check. )    Contact information:   [address] [place]  [phone]       Follow up with Lewayne Bunting, MD On 06/28/2012. (At 10:45 AM for pacemaker follow-up.)    Contact information:   [address] [place] [address] [phone]       Discharge Medications:    Medication List    STOP taking these medications       carvedilol 6.25 MG tablet  Commonly known as:  COREG      TAKE these medications       acetaminophen 500 MG tablet  Commonly known as:  TYLENOL  Take 500 mg by mouth every 6 (six) hours as needed. For pain/fever     calcium-vitamin D 500-200 MG-UNIT per tablet  Commonly known as:  OSCAL WITH D  Take 1 tablet by mouth every evening.     CENTRUM SILVER PO  Take 1 tablet by mouth daily.     furosemide 40 MG tablet  Commonly known as:  LASIX  Take 80 mg by mouth every morning.     hydrOXYzine 10 MG tablet  Commonly known as:  ATARAX/VISTARIL  Take 10 mg by mouth as  needed. For itching     lisinopril 10 MG tablet  Commonly known as:  PRINIVIL,ZESTRIL  Take 5 mg by mouth every morning.     potassium chloride SA 20 MEQ tablet  Commonly known as:  K-DUR,KLOR-CON  Take 10 mEq by mouth 2 (two) times daily.     triamcinolone cream 0.5 %  Commonly known as:  KENALOG  Apply 1 application topically 2 (two) times daily as needed. For rash     warfarin 5 MG tablet  Commonly known as:  COUMADIN  Take 5-7.5 mg by mouth every evening. One and a half pills every day; 1 tablet Monday and Friday       Outstanding Labs/Studies: None  Duration of Discharge Encounter: Greater than 30 minutes including physician time.  Signed, R. Hurman Horn, PA-C 03/27/2012, 9:05 AM

## 2012-04-04 ENCOUNTER — Encounter: Payer: Self-pay | Admitting: Internal Medicine

## 2012-04-04 ENCOUNTER — Ambulatory Visit (INDEPENDENT_AMBULATORY_CARE_PROVIDER_SITE_OTHER): Payer: Medicare Other | Admitting: *Deleted

## 2012-04-04 ENCOUNTER — Other Ambulatory Visit: Payer: Self-pay | Admitting: Internal Medicine

## 2012-04-04 DIAGNOSIS — I428 Other cardiomyopathies: Secondary | ICD-10-CM

## 2012-04-04 DIAGNOSIS — I429 Cardiomyopathy, unspecified: Secondary | ICD-10-CM

## 2012-04-04 DIAGNOSIS — I4892 Unspecified atrial flutter: Secondary | ICD-10-CM

## 2012-04-04 LAB — PACEMAKER DEVICE OBSERVATION
AL IMPEDENCE PM: 587 Ohm
ATRIAL PACING PM: 16
BAMS-0001: 150 {beats}/min
BATTERY VOLTAGE: 2.79 V
RV LEAD AMPLITUDE: 15.68 mv
VENTRICULAR PACING PM: 2

## 2012-04-04 NOTE — Progress Notes (Signed)
Wound check pacer in clinic  

## 2012-05-16 ENCOUNTER — Telehealth: Payer: Self-pay | Admitting: Internal Medicine

## 2012-05-16 ENCOUNTER — Inpatient Hospital Stay (HOSPITAL_COMMUNITY)
Admission: EM | Admit: 2012-05-16 | Discharge: 2012-05-23 | DRG: 908 | Disposition: A | Discharge status: Skilled Nursing Facility | Payer: Medicare Other | Attending: Internal Medicine | Admitting: Internal Medicine

## 2012-05-16 ENCOUNTER — Encounter (HOSPITAL_COMMUNITY): Payer: Self-pay | Admitting: *Deleted

## 2012-05-16 DIAGNOSIS — Z79899 Other long term (current) drug therapy: Secondary | ICD-10-CM

## 2012-05-16 DIAGNOSIS — Y92009 Unspecified place in unspecified non-institutional (private) residence as the place of occurrence of the external cause: Secondary | ICD-10-CM

## 2012-05-16 DIAGNOSIS — T827XXD Infection and inflammatory reaction due to other cardiac and vascular devices, implants and grafts, subsequent encounter: Secondary | ICD-10-CM

## 2012-05-16 DIAGNOSIS — I4892 Unspecified atrial flutter: Secondary | ICD-10-CM

## 2012-05-16 DIAGNOSIS — T81329A Deep disruption or dehiscence of operation wound, unspecified, initial encounter: Principal | ICD-10-CM | POA: Diagnosis present

## 2012-05-16 DIAGNOSIS — I872 Venous insufficiency (chronic) (peripheral): Secondary | ICD-10-CM

## 2012-05-16 DIAGNOSIS — I739 Peripheral vascular disease, unspecified: Secondary | ICD-10-CM | POA: Diagnosis present

## 2012-05-16 DIAGNOSIS — T8132XA Disruption of internal operation (surgical) wound, not elsewhere classified, initial encounter: Principal | ICD-10-CM | POA: Diagnosis present

## 2012-05-16 DIAGNOSIS — R55 Syncope and collapse: Secondary | ICD-10-CM

## 2012-05-16 DIAGNOSIS — T827XXA Infection and inflammatory reaction due to other cardiac and vascular devices, implants and grafts, initial encounter: Secondary | ICD-10-CM

## 2012-05-16 DIAGNOSIS — I129 Hypertensive chronic kidney disease with stage 1 through stage 4 chronic kidney disease, or unspecified chronic kidney disease: Secondary | ICD-10-CM | POA: Diagnosis present

## 2012-05-16 DIAGNOSIS — Z950 Presence of cardiac pacemaker: Secondary | ICD-10-CM | POA: Diagnosis present

## 2012-05-16 DIAGNOSIS — I495 Sick sinus syndrome: Secondary | ICD-10-CM | POA: Diagnosis present

## 2012-05-16 DIAGNOSIS — Z7901 Long term (current) use of anticoagulants: Secondary | ICD-10-CM

## 2012-05-16 DIAGNOSIS — F0390 Unspecified dementia without behavioral disturbance: Secondary | ICD-10-CM | POA: Diagnosis present

## 2012-05-16 DIAGNOSIS — I509 Heart failure, unspecified: Secondary | ICD-10-CM | POA: Diagnosis present

## 2012-05-16 DIAGNOSIS — I5022 Chronic systolic (congestive) heart failure: Secondary | ICD-10-CM

## 2012-05-16 DIAGNOSIS — I951 Orthostatic hypotension: Secondary | ICD-10-CM

## 2012-05-16 DIAGNOSIS — I429 Cardiomyopathy, unspecified: Secondary | ICD-10-CM

## 2012-05-16 DIAGNOSIS — Y831 Surgical operation with implant of artificial internal device as the cause of abnormal reaction of the patient, or of later complication, without mention of misadventure at the time of the procedure: Secondary | ICD-10-CM | POA: Diagnosis present

## 2012-05-16 DIAGNOSIS — N183 Chronic kidney disease, stage 3 unspecified: Secondary | ICD-10-CM | POA: Diagnosis present

## 2012-05-16 DIAGNOSIS — I4891 Unspecified atrial fibrillation: Secondary | ICD-10-CM

## 2012-05-16 HISTORY — DX: Orthostatic hypotension: I95.1

## 2012-05-16 HISTORY — DX: Unspecified atrial fibrillation: I48.91

## 2012-05-16 HISTORY — DX: Syncope and collapse: R55

## 2012-05-16 HISTORY — DX: Venous insufficiency (chronic) (peripheral): I87.2

## 2012-05-16 HISTORY — DX: Unspecified atrial flutter: I48.92

## 2012-05-16 HISTORY — DX: Chronic systolic (congestive) heart failure: I50.22

## 2012-05-16 LAB — CBC
HCT: 38.6 % — ABNORMAL LOW (ref 39.0–52.0)
Hemoglobin: 13.5 g/dL (ref 13.0–17.0)
MCV: 92.8 fL (ref 78.0–100.0)
RBC: 4.16 MIL/uL — ABNORMAL LOW (ref 4.22–5.81)
WBC: 10 10*3/uL (ref 4.0–10.5)

## 2012-05-16 LAB — BASIC METABOLIC PANEL
CO2: 31 mEq/L (ref 19–32)
Chloride: 99 mEq/L (ref 96–112)
GFR calc Af Amer: 45 mL/min — ABNORMAL LOW (ref >90)
Potassium: 4.1 mEq/L (ref 3.5–5.1)
Sodium: 138 mEq/L (ref 135–145)

## 2012-05-16 LAB — PROTIME-INR: INR: 1.79 — ABNORMAL HIGH (ref 0.00–1.49)

## 2012-05-16 MED ORDER — LISINOPRIL 5 MG PO TABS
5.0000 mg | ORAL_TABLET | Freq: Every morning | ORAL | Status: DC
  Administered 2012-05-17 – 2012-05-18 (×2): 5 mg via ORAL
  Filled 2012-05-16 (×2): qty 1

## 2012-05-16 MED ORDER — SODIUM CHLORIDE 0.9 % IV SOLN: 250.0000 mL | INTRAVENOUS | Status: DC | PRN

## 2012-05-16 MED ORDER — VANCOMYCIN HCL 10 G IV SOLR
1250.0000 mg | INTRAVENOUS | Status: DC
  Administered 2012-05-16 – 2012-05-17 (×2): 1250 mg via INTRAVENOUS
  Filled 2012-05-16 (×4): qty 1250

## 2012-05-16 MED ORDER — SODIUM CHLORIDE 0.9 % IJ SOLN
3.0000 mL | Freq: Two times a day (BID) | INTRAMUSCULAR | Status: DC
  Administered 2012-05-16 – 2012-05-17 (×3): 3 mL via INTRAVENOUS

## 2012-05-16 MED ORDER — HYDROXYZINE HCL 10 MG PO TABS
10.0000 mg | ORAL_TABLET | ORAL | Status: DC | PRN
  Filled 2012-05-16 (×2): qty 1

## 2012-05-16 MED ORDER — ONDANSETRON HCL 4 MG/2ML IJ SOLN: 4.0000 mg | Freq: Four times a day (QID) | INTRAMUSCULAR | Status: DC | PRN

## 2012-05-16 MED ORDER — TRAMADOL HCL 50 MG PO TABS
50.0000 mg | ORAL_TABLET | Freq: Two times a day (BID) | ORAL | Status: DC | PRN
  Filled 2012-05-16: qty 1

## 2012-05-16 MED ORDER — TRIAMCINOLONE ACETONIDE 0.5 % EX CREA
1.0000 "application " | TOPICAL_CREAM | Freq: Two times a day (BID) | CUTANEOUS | Status: DC | PRN
  Filled 2012-05-16: qty 15

## 2012-05-16 MED ORDER — ACETAMINOPHEN 500 MG PO TABS
500.0000 mg | ORAL_TABLET | Freq: Four times a day (QID) | ORAL | Status: DC | PRN
  Filled 2012-05-16: qty 1

## 2012-05-16 MED ORDER — ADULT MULTIVITAMIN W/MINERALS CH
1.0000 | ORAL_TABLET | Freq: Every day | ORAL | Status: DC
  Administered 2012-05-17: 1 via ORAL
  Filled 2012-05-16 (×2): qty 1

## 2012-05-16 MED ORDER — POTASSIUM CHLORIDE CRYS ER 10 MEQ PO TBCR
10.0000 meq | EXTENDED_RELEASE_TABLET | Freq: Two times a day (BID) | ORAL | Status: DC
  Administered 2012-05-16 – 2012-05-18 (×4): 10 meq via ORAL
  Filled 2012-05-16 (×5): qty 1

## 2012-05-16 MED ORDER — FUROSEMIDE 80 MG PO TABS
80.0000 mg | ORAL_TABLET | Freq: Every morning | ORAL | Status: DC
  Administered 2012-05-17 – 2012-05-18 (×2): 80 mg via ORAL
  Filled 2012-05-16 (×2): qty 1

## 2012-05-16 MED ORDER — CALCIUM CARBONATE 600 MG PO TABS
600.0000 mg | ORAL_TABLET | Freq: Every day | ORAL | Status: DC
  Filled 2012-05-16: qty 1

## 2012-05-16 MED ORDER — SODIUM CHLORIDE 0.9 % IJ SOLN: 3.0000 mL | INTRAMUSCULAR | Status: DC | PRN

## 2012-05-16 MED ORDER — CARVEDILOL 6.25 MG PO TABS
6.2500 mg | ORAL_TABLET | Freq: Two times a day (BID) | ORAL | Status: DC
  Administered 2012-05-17 – 2012-05-18 (×3): 6.25 mg via ORAL
  Filled 2012-05-16 (×5): qty 1

## 2012-05-16 NOTE — Telephone Encounter (Signed)
New problem     Pace maker sight has reopened should pt come in

## 2012-05-16 NOTE — ED Notes (Signed)
PT with pacemaker placement 03/26/12 to ED stating the wound is coming apart.  Denies pain.  1 cm opening to site above pacemaker. PT denies pain.

## 2012-05-16 NOTE — ED Notes (Signed)
Trish, Metallurgist, from [place], assessing pt.

## 2012-05-16 NOTE — H&P (Signed)
History and Physical  Patient ID: Spencer Ramirez MRN: [id number], DOB: 1930-11-15 Date of Encounter: 05/16/2012, 2:33 PM Primary Physician: Kaleen Mask, MD Primary Cardiologist: Donnie Aho, [place] Ladona Ridgel  Chief Complaint: pacemaker wound is open  HPI: Spencer Ramirez is a 79 y/o M with history of chronic systolic CHF, mild dementia, recent syncope with as 8 second pause noted on telemetry who underwent Medtronic Bi-V pacemaker implantation 03/24/12. Post-procedurally he did well, wound check 04/04/12 without complication. Granddaughter reports INR was 2.2 in early March. For the past 3 days, the patient has had progressive dehiscence of his pacemaker pocket with pink drainage. His granddaughter had done some of his laundry and was able to trace back that he had been having drainage for approximately 2 weeks. He denies fever/chills or malaise but does endorse cough and nasal congestion. Today they noticed you could actually see the pacemaker device so called our office and were instructed to proceed to ED. He denies any other acute complaints. Vitals stable, afebrile with temp 97.8 checked in triage.  Past Medical History  Diagnosis Date  . Chronic systolic CHF (congestive heart failure)   . Cardiomyopathy   . Peripheral vascular disease     chronic venous insufficiency  . Chronic kidney disease     Stg 3  . Anticoagulant long-term use   . Dementia   . Sleep apnea   . Myocardial infarction   . Coronary artery disease   . Hypertension   . Sick sinus syndrome     a. s/p Medtronic Bi-V PPM 03/25/12  . Syncope     a. Secondary to SSS.  . Orthostatic hypotension   . Atrial flutter   . Atrial fibrillation      Most Recent Cardiac Studies: 2D Echo 03/25/12 Study Conclusions - Left ventricle: The cavity size was normal. Systolic function was moderately to severely reduced. The estimated ejection fraction was in the range of 30% to 35%. Akinesis of the entireinferolateral and inferior  myocardium. - Aortic valve: Mild regurgitation. - Mitral valve: Mild regurgitation. - Left atrium: The atrium was moderately dilated.     Surgical History:  Past Surgical History  Procedure Laterality Date  . Tonsillectomy    . Cardioversion  09/17/2011    Procedure: CARDIOVERSION;  Surgeon: Othella Boyer, MD;  Location[facility] ENDOSCOPY;  Service: Cardiovascular;  Laterality: N/A;  anesth start time 1005  . Pacemaker insertion  03/25/2012    Medtronic Adapta L dual-chamber pacemaker, serial number [id number]      Home Meds: Prior to Admission medications   Medication Sig Start Date End Date Taking? Authorizing Provider  carvedilol (COREG) 6.25 MG tablet Take 6.25 mg by mouth 2 (two) times daily with a meal.   Yes Historical Provider, MD  acetaminophen (TYLENOL) 500 MG tablet Take 500 mg by mouth every 6 (six) hours as needed. For pain/fever    Historical Provider, MD  calcium-vitamin D (OSCAL WITH D) 500-200 MG-UNIT per tablet Take 1 tablet by mouth every evening.    Historical Provider, MD  furosemide (LASIX) 40 MG tablet Take 80 mg by mouth every morning.     Historical Provider, MD  hydrOXYzine (ATARAX/VISTARIL) 10 MG tablet Take 10 mg by mouth as needed. For itching    Historical Provider, MD  lisinopril (PRINIVIL,ZESTRIL) 10 MG tablet Take 5 mg by mouth every morning.     Historical Provider, MD  Multiple Vitamins-Minerals (CENTRUM SILVER PO) Take 1 tablet by mouth daily.     Historical Provider,  MD  potassium chloride SA (K-DUR,KLOR-CON) 20 MEQ tablet Take 10 mEq by mouth 2 (two) times daily.    Historical Provider, MD  triamcinolone cream (KENALOG) 0.5 % Apply 1 application topically 2 (two) times daily as needed. For rash    Historical Provider, MD  warfarin (COUMADIN) 5 MG tablet Take 5-7.5 mg by mouth every evening. One and a half pills every day; 1 tablet Monday and Friday    Historical Provider, MD    Allergies: No Known Allergies  History   Social History  . Marital  Status: Widowed    Spouse Name: N/A    Number of Children: N/A  . Years of Education: N/A   Occupational History  . Not on file.   Social History Main Topics  . Smoking status: Never Smoker   . Smokeless tobacco: Not on file  . Alcohol Use: 0.6 oz/week    1 Glasses of wine per week     Comment: once a week  . Drug Use: No  . Sexually Active:    Other Topics Concern  . Not on file   Social History Narrative  . No narrative on file     Family History  Problem Relation Age of Onset  . Other      Patient does not remember    Review of Systems: General: negative for chills, fever, night sweats Cardiovascular: negative for chest pain, edema, orthopnea, palpitations, paroxysmal nocturnal dyspnea. Stable mild chronic dyspnea Dermatological: negative for rash Respiratory: negative for cough or wheezing Urologic: negative for hematuria Abdominal: negative for nausea, vomiting, diarrhea, bright red blood per rectum, melena, or hematemesis but does have history of heme + stool Neurologic: negative for visual changes, syncope, or dizziness All other systems reviewed and are otherwise negative except as noted above.  Labs: pending  Radiology/Studies:  No results found.   EKG: NSr 62bpm 1st degree AVB , possible lateral infarct age undetermined  Physical Exam: Blood pressure 100/62, pulse 63, temperature 96.8 F (36 C), temperature source Oral, resp. rate 16, SpO2 97.00%. General: Well developed, well nourished WM in no acute distress. Head: Normocephalic, atraumatic, sclera non-icteric, no xanthomas, nares are without discharge.  Neck: Supple. Lungs: Clear bilaterally to auscultation without wheezes, rales, or rhonchi. Breathing is unlabored. Heart: RRR with S1 S2. No murmurs, rubs, or gallops appreciated. Pacemaker site L upper chest with dehiscence of wound, pink serosanguinous fluid, ability to see small area of pacemaker can. Mild erythema circumferentially. Abdomen:  Soft, non-tender, non-distended with normoactive bowel sounds. No hepatomegaly. No rebound/guarding. No obvious abdominal masses. Msk:  Strength and tone appear normal for age. Extremities: No clubbing or cyanosis. Tr LE edema with varicosities noted bilaterally.  Distal pedal pulses are 2+ and equal bilaterally. Neuro: Alert and oriented X 3. No focal deficit. No facial asymmetry. Moves all extremities spontaneously. Psych:  Responds to questions appropriately with a normal affect but does occasionally need help from granddaughter for answers to historical questions    ASSESSMENT AND PLAN:   1. SSS s/p pacemaker insertion 03/24/12 with dehiscence and suppuration 2. Chronic systolic CHF, currently compensated 3. Atrial fib/atrial flutter, on chronic anticoagulation 4. Recent heme + stools as outpatient (colonoscopy was put on hold when syncope/pacemaker occurred) 5. CKD stage III  Admit, check basic labs including CBC. EKG shows NSR, not currently pacing. As discussed with Dr. Graciela Husbands, continue home medications except hold Coumadin and place on IV vancomycin per pharmacy. Make NPO after midnight Tuesday night with plan to return to  lab tentatively Wednesday with Dr. Ladona Ridgel. See addendum by Dr. Graciela Husbands.  Signed, Ronie Spies PA-C 05/16/2012, 2:33 PM

## 2012-05-16 NOTE — Telephone Encounter (Signed)
Spoke with Evalyn Casco, granddaughter of patient at [phone] who states patient's wound is completely open - they can see the device. Granddaughter was instructed to take patient to the hospital immediately.  She states wound has been open for days.  She states she is proceeding to [facility].  Stann Ore liason notified.

## 2012-05-16 NOTE — Progress Notes (Signed)
ANTIBIOTIC CONSULT NOTE - INITIAL  Pharmacy Consult for Vancomycin Indication: pacemaker site infection  No Known Allergies  Patient Measurements: Height: 5' 6.14" (168 cm) Weight: 169 lb 12.1 oz (77 kg) IBW/kg (Calculated) : 64.13  Vital Signs: Temp: 98.1 F (36.7 C) (03/31 1609) Temp src: Oral (03/31 1609) BP: 115/77 mmHg (03/31 1828) Pulse Rate: 69 (03/31 1828) Intake/Output from previous day:   Intake/Output from this shift:    Labs:  Recent Labs  05/16/12 1600  WBC 10.0  HGB 13.5  PLT 211  CREATININE 1.62*   Estimated Creatinine Clearance: 36.2 ml/min (by C-G formula based on Cr of 1.62). No results found for this basename: VANCOTROUGH, VANCOPEAK, VANCORANDOM, GENTTROUGH, GENTPEAK, GENTRANDOM, TOBRATROUGH, TOBRAPEAK, TOBRARND, AMIKACINPEAK, AMIKACINTROU, AMIKACIN,  in the last 72 hours   Microbiology: No results found for this or any previous visit (from the past 720 hour(s)).  Medical History: Past Medical History  Diagnosis Date  . Chronic systolic CHF (congestive heart failure)   . Cardiomyopathy   . Chronic venous insufficiency   . Chronic kidney disease     Stg 3  . Anticoagulant long-term use   . Dementia   . Sleep apnea   . Myocardial infarction   . Coronary artery disease   . Hypertension     Granddaughter denies - says low blood pressure  . Sick sinus syndrome     a. s/p Medtronic Bi-V PPM 03/25/12  . Syncope     a. Secondary to SSS.  . Orthostatic hypotension   . Atrial flutter   . 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 Noted elevated scr on admit d/t CKD, will renally adjust. Patient also with h/o afib and chronic coumadin, current INR is subtherapeutic. Coumadin to be held.  Goal of Therapy:  Vancomycin trough level 10-15 mcg/ml  Plan:  Vancomycin 1250mg  IV q24h and monitor renal  function. Measure antibiotic drug levels at steady state Follow up culture results  Verlene Mayer, PharmD, BCPS Pager [phone] 05/16/2012,6:34 PM

## 2012-05-16 NOTE — H&P (Signed)
Pt with pacer implanted 03/2012 for syncoope assoc with pauses of > 4 sec.  He is in need of betablockers for CHF and ICM  When evaluated at wound check he was 16% Apaced   His device is infected with erosion.  His granddaughter has raised the issue of whether he scratched the wound open as scratching is a problem.  She says it was well healed  He will need to undergo explantation.  He might be better served with NANOstim but will defer this decision to Dr Beather Arbour. He has mild dementia, knows he is at [facility], 2013 April sort of,does not know president. > may not be appropriate for a study  Will begin vancomycin for now.  He is not ill with drainage of the device externally

## 2012-05-16 NOTE — ED Notes (Signed)
Dinner tray ordered, pt. Ate a snack while in the Ed

## 2012-05-17 DIAGNOSIS — I498 Other specified cardiac arrhythmias: Secondary | ICD-10-CM

## 2012-05-17 DIAGNOSIS — I10 Essential (primary) hypertension: Secondary | ICD-10-CM

## 2012-05-17 DIAGNOSIS — T82190A Other mechanical complication of cardiac electrode, initial encounter: Secondary | ICD-10-CM

## 2012-05-17 LAB — BASIC METABOLIC PANEL
BUN: 29 mg/dL — ABNORMAL HIGH (ref 6–23)
CO2: 29 mEq/L (ref 19–32)
Calcium: 9.2 mg/dL (ref 8.4–10.5)
Creatinine, Ser: 1.51 mg/dL — ABNORMAL HIGH (ref 0.50–1.35)
GFR calc Af Amer: 49 mL/min — ABNORMAL LOW (ref >90)

## 2012-05-17 LAB — CBC
HCT: 36.7 % — ABNORMAL LOW (ref 39.0–52.0)
MCH: 31.2 pg (ref 26.0–34.0)
MCV: 92.4 fL (ref 78.0–100.0)
Platelets: 172 10*3/uL (ref 150–400)
RDW: 13.4 % (ref 11.5–15.5)

## 2012-05-17 LAB — PROTIME-INR: Prothrombin Time: 20.3 seconds — ABNORMAL HIGH (ref 11.6–15.2)

## 2012-05-17 MED ORDER — CALCIUM CARBONATE 1250 (500 CA) MG PO TABS
1.0000 | ORAL_TABLET | Freq: Every day | ORAL | Status: DC
  Administered 2012-05-17: 500 mg via ORAL
  Filled 2012-05-17 (×2): qty 1

## 2012-05-17 NOTE — Progress Notes (Signed)
Utilization Review Completed Coley Kulikowski J. Blondina Coderre, RN, BSN, NCM [phone]  

## 2012-05-17 NOTE — Progress Notes (Signed)
Patient ID: Spencer Ramirez, male   DOB: 07/17/1930, 79 y.o.   MRN: [id number] Subjective:  He denies chest pain, no fevers, no chills.  Objective:  Vital Signs in the last 24 hours: Temp:  [97.2 F (36.2 C)-98.1 F (36.7 C)] 97.2 F (36.2 C) (04/01 1405) Pulse Rate:  [61-69] 62 (04/01 1405) Resp:  [10-19] 18 (04/01 1405) BP: (90-122)/(56-82) 95/56 mmHg (04/01 1405) SpO2:  [9 %-100 %] 93 % (04/01 1405) Weight:  [169 lb 12.1 oz (77 kg)-171 lb 8 oz (77.792 kg)] 171 lb 4.8 oz (77.7 kg) (04/01 0555)  Intake/Output from previous day: 03/31 0701 - 04/01 0700 In: 370 [P.O.:120; IV Piggyback:250] Out: 875 [Urine:875] Intake/Output from this shift: Total I/O In: 600 [P.O.:600] Out: 750 [Urine:750]  Physical Exam: Well appearing NAD HEENT: Unremarkable Neck:  No JVD, no thyromegally Lymphatics:  No adenopathy Back:  No CVA tenderness Lungs:  Clear,with no wheezes, rales, or rhonchi. Pacemaker pocket is open. Minimal drainage. No erythema. HEART:  Regular rate rhythm, no murmurs, no rubs, no clicks Abd:  Flat, positive bowel sounds, no organomegally, no rebound, no guarding Ext:  2 plus pulses, no edema, no cyanosis, no clubbing Skin:  No rashes no nodules Neuro:  CN II through XII intact, motor grossly intact  Lab Results:  Recent Labs  05/16/12 1600 05/17/12 0544  WBC 10.0 7.8  HGB 13.5 12.4*  PLT 211 172    Recent Labs  05/16/12 1600 05/17/12 0544  NA 138 139  K 4.1 4.0  CL 99 101  CO2 31 29  GLUCOSE 77 80  BUN 27* 29*  CREATININE 1.62* 1.51*   No results found for this basename: TROPONINI, CK, MB,  in the last 72 hours Hepatic Function Panel No results found for this basename: PROT, ALBUMIN, AST, ALT, ALKPHOS, BILITOT, BILIDIR, IBILI,  in the last 72 hours No results found for this basename: CHOL,  in the last 72 hours No results found for this basename: PROTIME,  in the last 72 hours  Imaging: No results found.  Cardiac Studies: Tele - normal sinus  rhythm Assessment/Plan:  1. Infected pacemaker pocket 2. History of bradycardia 3. Hypertension Rec: we'll plan on removing his device in total tomorrow. I discussed the risk, benefits, goals, and expectations of the procedure, and he wishes to proceed.  LOS: 1 day    Gregg Taylor,M.D. 05/17/2012, 2:29 PM    

## 2012-05-18 ENCOUNTER — Encounter (HOSPITAL_COMMUNITY)
Admission: EM | Disposition: A | Discharge status: Skilled Nursing Facility | Payer: Self-pay | Source: Home / Self Care | Attending: Internal Medicine

## 2012-05-18 DIAGNOSIS — T82190A Other mechanical complication of cardiac electrode, initial encounter: Secondary | ICD-10-CM

## 2012-05-18 HISTORY — PX: TEMPORARY PACEMAKER INSERTION: SHX5471

## 2012-05-18 HISTORY — PX: LEAD REMOVAL: SHX5465

## 2012-05-18 SURGERY — LEAD REMOVAL
Anesthesia: LOCAL

## 2012-05-18 MED ORDER — CEFAZOLIN SODIUM-DEXTROSE 2-3 GM-% IV SOLR
2.0000 g | Freq: Four times a day (QID) | INTRAVENOUS | Status: DC
  Filled 2012-05-18 (×3): qty 50

## 2012-05-18 MED ORDER — SODIUM CHLORIDE 0.9 % IV SOLN
INTRAVENOUS | Status: DC
  Administered 2012-05-18: 09:00:00 via INTRAVENOUS

## 2012-05-18 MED ORDER — CEFAZOLIN SODIUM 1-5 GM-% IV SOLN
INTRAVENOUS | Status: AC
  Filled 2012-05-18: qty 100

## 2012-05-18 MED ORDER — ACETAMINOPHEN 325 MG PO TABS: 325.0000 mg | ORAL_TABLET | ORAL | Status: DC | PRN

## 2012-05-18 MED ORDER — LIDOCAINE HCL (PF) 1 % IJ SOLN
INTRAMUSCULAR | Status: AC
  Filled 2012-05-18: qty 60

## 2012-05-18 MED ORDER — CHLORHEXIDINE GLUCONATE 4 % EX LIQD
60.0000 mL | Freq: Once | CUTANEOUS | Status: AC
  Administered 2012-05-18: 4 via TOPICAL
  Filled 2012-05-18: qty 60

## 2012-05-18 MED ORDER — CEFAZOLIN SODIUM-DEXTROSE 2-3 GM-% IV SOLR
2.0000 g | Freq: Four times a day (QID) | INTRAVENOUS | Status: AC
  Administered 2012-05-18 – 2012-05-19 (×3): 2 g via INTRAVENOUS
  Filled 2012-05-18 (×3): qty 50

## 2012-05-18 MED ORDER — HEPARIN (PORCINE) IN NACL 2-0.9 UNIT/ML-% IJ SOLN
INTRAMUSCULAR | Status: AC
  Filled 2012-05-18: qty 500

## 2012-05-18 MED ORDER — VANCOMYCIN HCL 10 G IV SOLR
1250.0000 mg | INTRAVENOUS | Status: DC
  Administered 2012-05-18 – 2012-05-20 (×3): 1250 mg via INTRAVENOUS
  Filled 2012-05-18 (×3): qty 1250

## 2012-05-18 MED ORDER — ONDANSETRON HCL 4 MG/2ML IJ SOLN: 4.0000 mg | Freq: Four times a day (QID) | INTRAMUSCULAR | Status: DC | PRN

## 2012-05-18 MED ORDER — CHLORHEXIDINE GLUCONATE 4 % EX LIQD
60.0000 mL | Freq: Once | CUTANEOUS | Status: DC
  Filled 2012-05-18: qty 60

## 2012-05-18 MED ORDER — PHYTONADIONE 1 MG/0.5 ML ORAL SOLUTION
2.0000 mg | Freq: Once | ORAL | Status: AC
  Administered 2012-05-18: 2 mg via ORAL
  Filled 2012-05-18: qty 1

## 2012-05-18 MED ORDER — FENTANYL CITRATE 0.05 MG/ML IJ SOLN
INTRAMUSCULAR | Status: AC
  Filled 2012-05-18: qty 2

## 2012-05-18 MED ORDER — SODIUM CHLORIDE 0.9 % IR SOLN
80.0000 mg | Status: DC
  Filled 2012-05-18: qty 2

## 2012-05-18 MED ORDER — MIDAZOLAM HCL 5 MG/5ML IJ SOLN
INTRAMUSCULAR | Status: AC
  Filled 2012-05-18: qty 5

## 2012-05-18 NOTE — Progress Notes (Signed)
New drainagetp pacemaker site tonight. PA on call notified and ordered to redress. Moderate amt of serosanguinous drainage under dressing. 4x4 gauze placed over area with sterile gloves and covered with tegaderm. Pacemaker site looked intact. WIll continue to monitor site. Baron Hamper, RN 05/18/2012 8:23 PM

## 2012-05-18 NOTE — Op Note (Signed)
PPM removal and insertion of a temporary permanent PPM without immediate complication. W#[id number].

## 2012-05-18 NOTE — Progress Notes (Signed)
Pacer removal site is still oozing this evening. INR 1.81 this AM. D/w Dr. Daleen Squibb. Will ask nursing to redress, give Vit K 2mg  PO now, recheck INR in AM. Ronie Spies PA-C

## 2012-05-18 NOTE — Interval H&P Note (Signed)
History and Physical Interval Note:  05/18/2012 10:38 AM  Spencer Ramirez  has presented today for surgery, with the diagnosis of Removal  The various methods of treatment have been discussed with the patient and family. After consideration of risks, benefits and other options for treatment, the patient has consented to  Procedure(s): Pacemaker removal (N/A) as a surgical intervention .  The patient's history has been reviewed, patient examined, no change in status, stable for surgery.  I have reviewed the patient's chart and labs.  Questions were answered to the patient's satisfaction.     [name].D.

## 2012-05-18 NOTE — Progress Notes (Signed)
Noted small bloody stain on pt's LCW dsg. On pacemaker site.  Also noted pt keep removing hand from his sling.  Instructed him to keep the sling on.  Verbalized understanding.  Call place to on call doctor.  lNellie Ace Gins, Charity fundraiser.

## 2012-05-18 NOTE — H&P (View-Only) (Signed)
Patient ID: Spencer Ramirez, male   DOB: 1930-06-03, 79 y.o.   MRN: [id number] Subjective:  He denies chest pain, no fevers, no chills.  Objective:  Vital Signs in the last 24 hours: Temp:  [97.2 F (36.2 C)-98.1 F (36.7 C)] 97.2 F (36.2 C) (04/01 1405) Pulse Rate:  [61-69] 62 (04/01 1405) Resp:  [10-19] 18 (04/01 1405) BP: (90-122)/(56-82) 95/56 mmHg (04/01 1405) SpO2:  [9 %-100 %] 93 % (04/01 1405) Weight:  [169 lb 12.1 oz (77 kg)-171 lb 8 oz (77.792 kg)] 171 lb 4.8 oz (77.7 kg) (04/01 0555)  Intake/Output from previous day: 03/31 0701 - 04/01 0700 In: 370 [P.O.:120; IV Piggyback:250] Out: 875 [Urine:875] Intake/Output from this shift: Total I/O In: 600 [P.O.:600] Out: 750 [Urine:750]  Physical Exam: Well appearing NAD HEENT: Unremarkable Neck:  No JVD, no thyromegally Lymphatics:  No adenopathy Back:  No CVA tenderness Lungs:  Clear,with no wheezes, rales, or rhonchi. Pacemaker pocket is open. Minimal drainage. No erythema. HEART:  Regular rate rhythm, no murmurs, no rubs, no clicks Abd:  Flat, positive bowel sounds, no organomegally, no rebound, no guarding Ext:  2 plus pulses, no edema, no cyanosis, no clubbing Skin:  No rashes no nodules Neuro:  CN II through XII intact, motor grossly intact  Lab Results:  Recent Labs  05/16/12 1600 05/17/12 0544  WBC 10.0 7.8  HGB 13.5 12.4*  PLT 211 172    Recent Labs  05/16/12 1600 05/17/12 0544  NA 138 139  K 4.1 4.0  CL 99 101  CO2 31 29  GLUCOSE 77 80  BUN 27* 29*  CREATININE 1.62* 1.51*   No results found for this basename: TROPONINI, CK, MB,  in the last 72 hours Hepatic Function Panel No results found for this basename: PROT, ALBUMIN, AST, ALT, ALKPHOS, BILITOT, BILIDIR, IBILI,  in the last 72 hours No results found for this basename: CHOL,  in the last 72 hours No results found for this basename: PROTIME,  in the last 72 hours  Imaging: No results found.  Cardiac Studies: Tele - normal sinus  rhythm Assessment/Plan:  1. Infected pacemaker pocket 2. History of bradycardia 3. Hypertension Rec: we'll plan on removing his device in total tomorrow. I discussed the risk, benefits, goals, and expectations of the procedure, and he wishes to proceed.  LOS: 1 day    Gregg Taylor,M.D. 05/17/2012, 2:29 PM

## 2012-05-18 NOTE — Progress Notes (Signed)
ANTIBIOTIC CONSULT NOTE  Pharmacy Consult for Vancomycin Indication: pacemaker site infection  No Known Allergies  Patient Measurements: Height: 5\' 6"  (167.6 cm) Weight: 170 lb 3.2 oz (77.202 kg) (scale a) IBW/kg (Calculated) : 63.8  Vital Signs: Temp: 97.3 F (36.3 C) (04/02 1013) Temp src: Oral (04/02 1013) BP: 100/62 mmHg (04/02 1013) Pulse Rate: 60 (04/02 1056) Intake/Output from previous day: 04/01 0701 - 04/02 0700 In: 1090 [P.O.:840; IV Piggyback:250] Out: 1000 [Urine:1000] Intake/Output from this shift:    Labs:  Recent Labs  05/16/12 1600 05/17/12 0544  WBC 10.0 7.8  HGB 13.5 12.4*  PLT 211 172  CREATININE 1.62* 1.51*   Estimated Creatinine Clearance: 38.8 ml/min (by C-G formula based on Cr of 1.51). No results found for this basename: VANCOTROUGH, VANCOPEAK, VANCORANDOM, GENTTROUGH, GENTPEAK, GENTRANDOM, TOBRATROUGH, TOBRAPEAK, TOBRARND, AMIKACINPEAK, AMIKACINTROU, AMIKACIN,  in the last 72 hours   Microbiology: No results found for this or any previous visit (from the past 720 hour(s)).  Medical History: Past Medical History  Diagnosis Date  . Chronic systolic CHF (congestive heart failure)   . Cardiomyopathy   . Chronic venous insufficiency   . Chronic kidney disease     Stg 3  . Anticoagulant long-term use   . Dementia   . Sleep apnea   . Myocardial infarction   . Coronary artery disease   . Hypertension     Granddaughter denies - says low blood pressure  . Sick sinus syndrome     a. s/p Medtronic Bi-V PPM 03/25/12  . Syncope     a. Secondary to SSS.  . Orthostatic hypotension   . Atrial flutter   . Atrial fibrillation     Medications:  Prescriptions prior to admission  Medication Sig Dispense Refill  . acetaminophen (TYLENOL) 500 MG tablet Take 500 mg by mouth every 6 (six) hours as needed for pain or fever.       . calcium carbonate (OS-CAL) 600 MG TABS Take 600 mg by mouth daily.      . carvedilol (COREG) 6.25 MG tablet Take 6.25 mg  by mouth 2 (two) times daily with a meal.      . furosemide (LASIX) 40 MG tablet Take 80 mg by mouth every morning.       . hydrOXYzine (ATARAX/VISTARIL) 10 MG tablet Take 10 mg by mouth every 6 (six) hours as needed for itching.       Marland Kitchen lisinopril (PRINIVIL,ZESTRIL) 10 MG tablet Take 5 mg by mouth every morning.       . Multiple Vitamin (MULTIVITAMIN WITH MINERALS) TABS Take 1 tablet by mouth daily.      . potassium chloride SA (K-DUR,KLOR-CON) 20 MEQ tablet Take 10 mEq by mouth 2 (two) times daily.      Marland Kitchen triamcinolone cream (KENALOG) 0.5 % Apply 1 application topically 2 (two) times daily as needed. For rash      . warfarin (COUMADIN) 5 MG tablet Take 5-7.5 mg by mouth every evening. One and a half pills every day except on Monday and Friday when pt takes only 1 tab       Assessment: 79 y/o M with pacemaker site infection -- s/p removal with placement of temporary PPM today.  Discussed plan with Dr Ladona Ridgel who indicated that patient is to receive ancef x 3 doses and remain on vancomycin until d/c home.  SCr remains elevated consistent with CKD, CrCl ~40 ml/min.  Goal of Therapy:  Vancomycin trough level 10-15 mcg/ml  Plan:  - Resume  Vancomycin 1250mg  IV q24 - Follow up SCr, UOP, cultures, clinical course and adjust as clinically indicated    Robbi Spells L. Illene Bolus, PharmD, BCPS Clinical Pharmacist Pager: [phone] Pharmacy: [phone] 05/18/2012 1:22 PM

## 2012-05-18 NOTE — Progress Notes (Signed)
Orthopedic Tech Progress Note Patient Details:  Spencer Ramirez [birth year] [id number]  Ortho Devices Type of Ortho Device: Arm sling Ortho Device/Splint Location: left arm Ortho Device/Splint Interventions: Application   Zacaria Pousson 05/18/2012, 1:26 PM

## 2012-05-19 ENCOUNTER — Inpatient Hospital Stay (HOSPITAL_COMMUNITY): Payer: Medicare Other

## 2012-05-19 LAB — URINALYSIS, ROUTINE W REFLEX MICROSCOPIC
Leukocytes, UA: NEGATIVE
Nitrite: NEGATIVE
Specific Gravity, Urine: 1.013 (ref 1.005–1.030)
pH: 6.5 (ref 5.0–8.0)

## 2012-05-19 LAB — PROTIME-INR
INR: 1.21 (ref 0.00–1.49)
Prothrombin Time: 15.1 seconds (ref 11.6–15.2)

## 2012-05-19 MED ORDER — WARFARIN SODIUM 7.5 MG PO TABS
7.5000 mg | ORAL_TABLET | Freq: Once | ORAL | Status: AC
  Administered 2012-05-19: 7.5 mg via ORAL
  Filled 2012-05-19: qty 1

## 2012-05-19 MED ORDER — WARFARIN - PHARMACIST DOSING INPATIENT
Freq: Every day | Status: DC
  Administered 2012-05-21: 17:00:00

## 2012-05-19 MED ORDER — POTASSIUM CHLORIDE CRYS ER 20 MEQ PO TBCR
20.0000 meq | EXTENDED_RELEASE_TABLET | Freq: Every day | ORAL | Status: DC
  Administered 2012-05-19 – 2012-05-23 (×5): 20 meq via ORAL
  Filled 2012-05-19 (×5): qty 1

## 2012-05-19 MED ORDER — LISINOPRIL 5 MG PO TABS
5.0000 mg | ORAL_TABLET | Freq: Every day | ORAL | Status: DC
  Administered 2012-05-19: 5 mg via ORAL
  Filled 2012-05-19 (×5): qty 1

## 2012-05-19 MED ORDER — FUROSEMIDE 80 MG PO TABS
80.0000 mg | ORAL_TABLET | Freq: Every day | ORAL | Status: DC
  Administered 2012-05-19 – 2012-05-23 (×4): 80 mg via ORAL
  Filled 2012-05-19 (×5): qty 1

## 2012-05-19 MED ORDER — WARFARIN SODIUM 5 MG PO TABS: 5.0000 mg | ORAL_TABLET | Freq: Every day | ORAL | Status: DC

## 2012-05-19 MED ORDER — CARVEDILOL 6.25 MG PO TABS
6.2500 mg | ORAL_TABLET | Freq: Two times a day (BID) | ORAL | Status: DC
  Administered 2012-05-19 – 2012-05-23 (×3): 6.25 mg via ORAL
  Filled 2012-05-19 (×10): qty 1

## 2012-05-19 NOTE — Progress Notes (Signed)
   ELECTROPHYSIOLOGY ROUNDING NOTE    Patient Name: Spencer Ramirez Date of Encounter: 05-19-2012    SUBJECTIVE:Patient feels well.  No chest pain or shortness of breath.  Minimal incisional soreness.  S/p PPM system removal and insertion of temp-perm pacemaker through left IJ 05-18-2012. Pressure dressing in place.   TELEMETRY: Reviewed telemetry pt in sinus rhythm with intermittent V pacing at 40bpm Filed Vitals:   05/18/12 1600 05/18/12 2101 05/19/12 0112 05/19/12 0414  BP: 107/71 123/59 97/65 143/81  Pulse: 62 64 58 94  Temp:  97.8 F (36.6 C)  97.9 F (36.6 C)  TempSrc:  Oral  Oral  Resp:  16  18  Height:      Weight:    170 lb 4.8 oz (77.248 kg)  SpO2:  100%  98%    Intake/Output Summary (Last 24 hours) at 05/19/12 0640 Last data filed at 05/19/12 0518  Gross per 24 hour  Intake 823.33 ml  Output   1000 ml  Net -176.67 ml    LABS: Basic Metabolic Panel:  Recent Labs  16/10/96 1600 05/17/12 0544  NA 138 139  K 4.1 4.0  CL 99 101  CO2 31 29  GLUCOSE 77 80  BUN 27* 29*  CREATININE 1.62* 1.51*  CALCIUM 9.4 9.2   CBC:  Recent Labs  05/16/12 1600 05/17/12 0544  WBC 10.0 7.8  HGB 13.5 12.4*  HCT 38.6* 36.7*  MCV 92.8 92.4  PLT 211 172   INR: pending  Radiology/Studies:  pending  PHYSICAL EXAM Left chest with pressure dressing in place  DEVICE INTERROGATION: Device interrogation pending   Active Problems:   Sick sinus syndrome   S/P placement of cardiac pacemaker   Infection and inflammatory reaction due to cardiac device, implant, and graft   EP Attending  Patient is stable after temp.perm PM. Wound looks good. Will plan to investigate possibility of SNF. He would be a candidate for a leadless PPM but would like for incision to be healing or completely healed before inserting new device.  [name].D.

## 2012-05-19 NOTE — Progress Notes (Signed)
All of pt's meds have been DC.  MD paged to ensure that this correct.

## 2012-05-19 NOTE — Progress Notes (Signed)
ANTICOAGULATION CONSULT NOTE - Initial Consult  Pharmacy Consult for Coumadin Indication: atrial fibrillation  No Known Allergies  Patient Measurements: Height: 5\' 6"  (167.6 cm) Weight: 170 lb 4.8 oz (77.248 kg) (scale a) IBW/kg (Calculated) : 63.8   Vital Signs: Temp: 97.5 F (36.4 C) (04/03 1355) Temp src: Axillary (04/03 1355) BP: 97/54 mmHg (04/03 1355) Pulse Rate: 70 (04/03 1355)  Labs:  Recent Labs  05/16/12 1600 05/17/12 0544 05/19/12 0615  HGB 13.5 12.4*  --   HCT 38.6* 36.7*  --   PLT 211 172  --   LABPROT 20.2* 20.3* 15.1  INR 1.79* 1.81* 1.21  CREATININE 1.62* 1.51*  --     Estimated Creatinine Clearance: 38.8 ml/min (by C-G formula based on Cr of 1.51).   Medical History: Past Medical History  Diagnosis Date  . Chronic systolic CHF (congestive heart failure)   . Cardiomyopathy   . Chronic venous insufficiency   . Chronic kidney disease     Stg 3  . Anticoagulant long-term use   . Dementia   . Sleep apnea   . Myocardial infarction   . Coronary artery disease   . Hypertension     Granddaughter denies - says low blood pressure  . Sick sinus syndrome     a. s/p Medtronic Bi-V PPM 03/25/12  . Syncope     a. Secondary to SSS.  . Orthostatic hypotension   . Atrial flutter   . Atrial fibrillation     Medications:  Scheduled:  . carvedilol  6.25 mg Oral BID WC  . [COMPLETED]  ceFAZolin (ANCEF) IV  2 g Intravenous Q6H  . furosemide  80 mg Oral Daily  . lisinopril  5 mg Oral Daily  . [COMPLETED] phytonadione  2 mg Oral Once  . potassium chloride  20 mEq Oral Daily  . vancomycin  1,250 mg Intravenous Q24H  . [DISCONTINUED]  ceFAZolin (ANCEF) IV  2 g Intravenous Q6H  . [DISCONTINUED] warfarin  5 mg Oral Daily    Assessment: 79 yo M on Coumadin PTA for afib --  Admitted for pacer pocket infection.  S/p removal with placement of temporary PPM.  EP MD considering placement of leadless PPM once incision appropriately healed.    Pharmacy asked to  resume Coumadin dosing.  Note, education completed with patient and granddaughter today.  4/3: INR today is 1.21.  Last dose of coumadin taken 05/15/12.  Home dose: Coumadin 7.5 mg po daily except 5 mg Mon, Fri.  INR slightly low on admit - 1.8.  Goal of Therapy:  INR 2-3 Monitor platelets by anticoagulation protocol: Yes   Plan:  - Coumadin 7.5 mg po x1 (less aggressive dosing due to bleeding risk s/p pacer removal) - Daily INR  Jill Side L. Illene Bolus, PharmD, BCPS Clinical Pharmacist Pager: [phone] Pharmacy: [phone] 05/19/2012 2:19 PM

## 2012-05-19 NOTE — Progress Notes (Signed)
Pt HR on monitor btwn 40-60. Pacemaker pacing at a rate between 40-45. Pt asymptomatic/asleep. BP 97/65. MD notified and no new orders. Will continue to monitor. Baron Hamper, RN RN 05/19/2012 1:17 AM

## 2012-05-19 NOTE — Op Note (Signed)
Spencer Ramirez, SCALLY NO.:  [id number]  MEDICAL RECORD NO.:  [id number]  LOCATION:  4706                         FACILITY:  [facility]  PHYSICIAN:  Doylene Canning. Ladona Ridgel, MD    DATE OF BIRTH:  1931-01-31  DATE OF PROCEDURE:  05/18/2012 DATE OF DISCHARGE:                              OPERATIVE REPORT   PROCEDURE PERFORMED:  Removal of previously implanted dual-chamber pacemaker, which had developed an infection and insertion of a new temporary permanent transvenous pacemaker by way of the left internal jugular vein.  INTRODUCTION:  The patient is a 79 year old man who was admitted to the hospital with drainage from his pacemaker site.  His family provides a history and notes that he has very poor hygiene.  He routinely does not shower.  His granddaughter stated that he showers maybe once a week and does not wipe after himself according to his granddaughter.  The patient subsequently developed a pocket infection with drainage from its pocket. He is now referred for insertion of a temporary permanent pacemaker because of his history of asystole with 8-second pauses and removal of his old device.  DESCRIPTION OF PROCEDURE:  After informed consent was obtained, the patient was taken to the diagnostic EP lab in a fasting state.  After usual preparation and draping, his left internal jugular area was given lidocaine.  His left internal jugular vein was punctured and the Medtronic 5076 temporary permanent pacing lead, serial number [id number], was advanced into the right ventricle.  Mapping was carried out in the final site RV apex.  The R-waves were 6-10 mV.  The pacing impedance with lead actively fixed was 1100 ohms.  The threshold was less than a V at 0.5 milliseconds.  With these satisfactory parameters, the lead was secured to the skin with silk suture.  The sewing sleeve was secured with silk suture.  The old pacemaker system was then evaluated.  The old pocket was  infiltrated with subcutaneous lidocaine. A 5-cm incision was carried out over the old pocket and the generator was removed with gentle traction and cutting of the suture. Electrocautery was utilized to free up the leads and they were removed after the helixes were retracted from each lead.  After hemostasis was obtained, the pocket was revised.  Electrocautery was utilized to assure hemostasis.  The pocket was then irrigated with copious amounts of antibiotic irrigation and the incision was closed with simple mattress Prolene suture.  Iodoform gauze was packed used to pack the pocket and bandages were placed.  The newly implanted temporary permanent pacing lead was connected to an external pacemaker and secured and the patient was returned to the recovery area in satisfactory condition.  COMPLICATIONS:  There were no immediate procedure complications.  RESULTS:  Demonstrate successful insertion of a temporary permanent transvenous pacing system and removal of an old dual-chamber pacing system without immediate procedure complication.     Doylene Canning. Ladona Ridgel, MD     GWT/MEDQ  D:  05/18/2012  T:  05/19/2012  Job:  [id number]  cc:   Georga Hacking, M.D.

## 2012-05-20 LAB — PROTIME-INR
INR: 1.21 (ref 0.00–1.49)
Prothrombin Time: 15.1 seconds (ref 11.6–15.2)

## 2012-05-20 LAB — VANCOMYCIN, TROUGH: Vancomycin Tr: 22 ug/mL — ABNORMAL HIGH (ref 10.0–20.0)

## 2012-05-20 MED ORDER — WARFARIN SODIUM 10 MG PO TABS
10.0000 mg | ORAL_TABLET | Freq: Once | ORAL | Status: AC
  Administered 2012-05-20: 10 mg via ORAL
  Filled 2012-05-20: qty 1

## 2012-05-20 MED ORDER — VANCOMYCIN HCL IN DEXTROSE 1-5 GM/200ML-% IV SOLN
1000.0000 mg | INTRAVENOUS | Status: DC
  Administered 2012-05-21 – 2012-05-22 (×2): 1000 mg via INTRAVENOUS
  Filled 2012-05-20 (×3): qty 200

## 2012-05-20 NOTE — Progress Notes (Signed)
Patient recommended for SNF placement. Bed search initiated after ok given by patient and his granddaughter Cala Bradford.  They are requesting placement at [facility].  FL2 sent to facilities; bed offer received from [facility].  Charge nurse has attempted throughout the day to locate MD to determine date of stability for d/c. Facility will accept on Saturday as long as they receive d/c summary and patient by 12:00.  Message left re: above with patient's granddaughter.  Will ask weekend CSW to follow up with MD/family and patient.  Lorri Frederick. [place]  [phone]  .

## 2012-05-20 NOTE — Progress Notes (Signed)
Changed pt's dressing per Dr. Bruna Potter verbal order.  Old dressing was saturated with blood.  New dressing is clean dry and intact.  Will continue to monitor.

## 2012-05-20 NOTE — Discharge Summary (Signed)
ELECTROPHYSIOLOGY PROCEDURE DISCHARGE SUMMARY    Patient ID: Spencer Ramirez,  MRN: [id number], DOB/AGE: [birth year] 79 y.o.  Admit date: 05/16/2012 Discharge date: 05/23/2012  Primary Care Physician: Windle Guard, MD Primary Cardiologist: Viann Fish, MD Electrophysiologist: Lewayne Bunting, MD  Primary Discharge Diagnosis:  1. Wound dehiscence/pocket erosion s/p PPM system removal and implantation of temp-perm pacemakef 2. SSS with sinus pauses >4 seconds and associated syncope   Secondary Discharge Diagnosis:  1. Orthostatic hypotension 2. Atrial flutter/atrial fibrillation 3. Chronic anticoagulation with Coumadin 4. Chronic systolic heart failure 5. CKD, stage III 6. PVD 7. Dementia 8. Hypertension  Procedures This Admission:  1.  Successful insertion of a temporary permanent transvenous pacing system and removal of an old dual-chamber pacing system without immediate procedure complication.  Brief HPI: The patient is a 79 year old man who was admitted to [facility] on 03/24/12 to undergo placement of a dual-chamber PPM for SSS with resultant syncope. He was admitted to [facility] by Dr. Donnie Aho on 03/24/12. He experienced a syncopal episode and seizure as well as an 8 second pause noted on telemetry monitoring. He is history of dementia, atrial flutter on chronic Coumadin anticoagulation and prior DCCV, CKD stage III. His prior Cardiolite scan done in May of 2013 revealing an EF of 33% and large inferolateral infarct. He has been treated medically for this given his underlying dementia. He reportedly had a seizure while sitting at a table which was witnessed by his daughter. He lost consciousness but shortly became responsive after the episode. He followed up with his PCP and Dr. Donnie Aho in the office for routine examination. In light of his recent syncopal episode and a monitor was placed which revealed an 8 second pause the morning of admission. Given his recent history and  documented sick sinus syndrome, he was admitted to the hospital for further observation and possible pacemaker placement.  At wound check, he was noted to have erosion of his device.  Because of this he was referred for pacemaker system extraction.   Hospital Course:  The patient was admitted and underwent pacemaker system extraction as well as placement of a temp-perm pacemaker on 05-18-2012 with details as outlined above. He was given IV Vancomycin. He was monitored on telemetry which demonstrated sinus rhythm with intermittent ventricular pacing. His left chest was packed with need for frequent dressing changes. Because of this, he was referred for placement in SNF.  He remains hemodynamically stable and afebrile. He has been seen, examined and deemed stable for DC to SNF today by Dr. Lewayne Bunting.  Discharge Vitals: Blood pressure 92/50, pulse 62, temperature 97.6 F (36.4 C), temperature source Oral, resp. rate 19, height 5\' 6"  (1.676 m), weight 169 lb 15.6 oz (77.1 kg), SpO2 97.00%.    Labs: Lab Results  Component Value Date   WBC 7.8 05/17/2012   HGB 12.4* 05/17/2012   HCT 36.7* 05/17/2012   MCV 92.4 05/17/2012   PLT 172 05/17/2012    Recent Labs Lab 05/17/12 0544  NA 139  K 4.0  CL 101  CO2 29  BUN 29*  CREATININE 1.51*  CALCIUM 9.2  GLUCOSE 80    Discharge Medications:    Medication List    TAKE these medications       acetaminophen 500 MG tablet  Commonly known as:  TYLENOL  Take 500 mg by mouth every 6 (six) hours as needed for pain or fever.     calcium carbonate 600 MG Tabs  Commonly known  as:  OS-CAL  Take 600 mg by mouth daily.     carvedilol 6.25 MG tablet  Commonly known as:  COREG  Take 6.25 mg by mouth 2 (two) times daily with a meal.     cephALEXin 500 MG capsule  Commonly known as:  KEFLEX  Take 1 capsule (500 mg total) by mouth 2 (two) times daily. For 7 days then stop.     furosemide 40 MG tablet  Commonly known as:  LASIX  Take 80 mg by mouth every  morning.     hydrOXYzine 10 MG tablet  Commonly known as:  ATARAX/VISTARIL  Take 10 mg by mouth every 6 (six) hours as needed for itching.     lisinopril 10 MG tablet  Commonly known as:  PRINIVIL,ZESTRIL  Take 5 mg by mouth every morning.     multivitamin with minerals Tabs  Take 1 tablet by mouth daily.     potassium chloride SA 20 MEQ tablet  Commonly known as:  K-DUR,KLOR-CON  Take 1 tablet (20 mEq total) by mouth daily.     triamcinolone cream 0.5 %  Commonly known as:  KENALOG  Apply 1 application topically 2 (two) times daily as needed. For rash     warfarin 5 MG tablet  Commonly known as:  COUMADIN  Take 5-7.5 mg by mouth every evening. One and a half pills every day except on Monday and Friday when pt takes only 1 tab       Disposition:  Improved, stable  Follow-up: Follow-up Information   Follow up with [facility] On 06/02/2012. (At 11:00 AM for wound check)    Contact information:   [address] [place] [address] [phone]      Follow up with Lewayne Bunting, MD On 06/28/2012. (At 10:45 AM)    Contact information:   [address] [place] [address] [phone]     Duration of Discharge Encounter: Greater than 30 minutes including physician time.

## 2012-05-20 NOTE — Progress Notes (Signed)
Utilization Review Completed Delois Tolbert J. German Manke, RN, BSN, NCM [phone]  

## 2012-05-20 NOTE — Progress Notes (Signed)
ANTICOAGULATION CONSULT NOTE - Follow Up Consult  Pharmacy Consult for Coumadin and Vancomycin #5 Indication: atrial fibrillation  + pacer pocket infection   No Known Allergies  Patient Measurements: Height: 5\' 6"  (167.6 cm) Weight: 165 lb 9.1 oz (75.1 kg) IBW/kg (Calculated) : 63.8 Heparin Dosing Weight:   Vital Signs: Temp: 97.7 F (36.5 C) (04/04 0633) Temp src: Oral (04/04 0633) BP: 100/57 mmHg (04/04 0633) Pulse Rate: 73 (04/04 0633)  Labs:  Recent Labs  05/19/12 0615 05/20/12 0647  LABPROT 15.1 15.1  INR 1.21 1.21    Estimated Creatinine Clearance: 35.8 ml/min (by C-G formula based on Cr of 1.51).   Assessment: 79 y/o M on coumadin PTA for afib - admitted for pacer pocket infection. S/p removal with placement of temporary PPM. EP MD considering placement of leadless PPM once incision appropriately healed.   AC: Afib. INR 1.21 (no change) (PTA Coumadin 7.5 mg po daily except 5 mg Mon, Fri. INR slightly low on admit - 1.8.)  ID: pacer site infection -- removed 4/3. Afbrile. WBC 7.8. 3/31 vanc>>  CV: 100/57, HR 73 on Coreg, Lasix, lisinopril, K Endo: glucose ok on bmp GI/Nutr: Clears Neuro: dementia Renal: CKD, SCr 1.5 (CrCl 35) Pulm: RA Heme/Onc: cbc ok Note, education completed with patient and granddaughter today. PTA Meds: no issues   Goal of Therapy:  INR 2-3 Vanco trough 10-15 Monitor platelets by anticoagulation protocol: Yes   Plan:  - Coumadin 10mg  po x 1 today. - Daily INR - Vanc 1250mg  IV q24h--trough tonight.    Reola Buckles S. Merilynn Finland, PharmD, BCPS Clinical Staff Pharmacist Pager [phone]  Misty Stanley Stillinger 05/20/2012,8:31 AM

## 2012-05-20 NOTE — Progress Notes (Signed)
Antibiotic CONSULT NOTE - Follow Up Consult  Pharmacy Consult for  Vancomycin #5 Indication:  pacer pocket infection   No Known Allergies  Patient Measurements: Height: 5\' 6"  (167.6 cm) Weight: 165 lb 9.1 oz (75.1 kg) IBW/kg (Calculated) : 63.8 Heparin Dosing Weight:   Vital Signs: Temp: 98 F (36.7 C) (04/04 1455) Temp src: Oral (04/04 1455) BP: 100/51 mmHg (04/04 1455) Pulse Rate: 70 (04/04 1455)  Labs:  Recent Labs  05/19/12 0615 05/20/12 0647  LABPROT 15.1 15.1  INR 1.21 1.21    Estimated Creatinine Clearance: 35.8 ml/min (by C-G formula based on Cr of 1.51).   Assessment: 79 y/o M on coumadin PTA for afib - admitted for pacer pocket infection. S/p removal with placement of temporary PPM. EP MD considering placement of leadless PPM once incision appropriately healed.   AC: Afib. INR 1.21 (no change) (PTA Coumadin 7.5 mg po daily except 5 mg Mon, Fri. INR slightly low on admit - 1.8.)  ID: pacer site infection -- removed 4/3. Afbrile. WBC 7.8. 3/31 vanc 1250mg  IV q24>>  Vancomycin trough drawn tonight  = 22 slightly higher than goal   Goal of Therapy:   Vanco trough 15-20 Monitor platelets by anticoagulation protocol: Yes   Plan:  - Decrease Vanc 1000mg  IV q24h-  Leota Sauers Pharm.D. CPP, BCPS Clinical Pharmacist [phone] 05/20/2012 10:44 PM

## 2012-05-20 NOTE — Evaluation (Signed)
Physical Therapy Evaluation Patient Details Name: Spencer Ramirez MRN: [id number] DOB: Jun 05, 1930 Today's Date: 05/20/2012 Time: 4098-1191 PT Time Calculation (min): 29 min  PT Assessment / Plan / Recommendation Clinical Impression  Patient is a 79 y/o male admitted with pacemaker infection s/p removal and placement of temporary permanent pacemaker.  Pt presents close to baseline for mobility except due to dementia needs cues for safety to prevent further issues with left UE in that he cannot remember to follow precautions.  Family relates concerns regarding hygiene and risk for reinfection.  Feel will benefit from OT consult to look at pt's self care skills and may need short term SNF for safety with 24 hour care due to fall risk, pacemaker precautions, and to educate on proper hygiene with OT involvement.    PT Assessment  Patient needs continued PT services    Follow Up Recommendations  SNF    Does the patient have the potential to tolerate intense rehabilitation      Barriers to Discharge        Equipment Recommendations  None recommended by PT    Recommendations for Other Services   OT consult  Frequency Min 3X/week    Precautions / Restrictions Precautions Precautions: Fall;ICD/Pacemaker Required Braces or Orthoses: Other Brace/Splint Other Brace/Splint: sling left UE   Pertinent Vitals/Pain Denies pain      Mobility  Bed Mobility Bed Mobility: Supine to Sit;Sit to Supine Supine to Sit: 5: Supervision Sit to Supine: 5: Supervision Details for Bed Mobility Assistance: cues for not using left UE Transfers Transfers: Sit to Stand;Stand to Sit Sit to Stand: 5: Supervision Stand to Sit: 5: Supervision Details for Transfer Assistance: cues to not use left UE Ambulation/Gait Ambulation/Gait Assistance: 5: Supervision;4: Min guard Ambulation Distance (Feet): 125 Feet Assistive device: Straight cane Ambulation/Gait Assistance Details: minguard for safety on turns  Gait  Pattern: Step-through pattern;Decreased stride length;Trunk flexed;Shuffle    Exercises Other Exercises Other Exercises: Initiated education with pt and grandaughter regarding home safety for fall prevention.   PT Diagnosis: Abnormality of gait  PT Problem List: Decreased balance;Decreased mobility;Decreased safety awareness;Decreased cognition PT Treatment Interventions: DME instruction;Gait training;Stair training;Functional mobility training;Therapeutic activities;Therapeutic exercise;Balance training;Neuromuscular re-education;Patient/family education   PT Goals Acute Rehab PT Goals PT Goal Formulation: With patient/family Time For Goal Achievement: 05/27/12 Potential to Achieve Goals: Good Pt will go Supine/Side to Sit: Independently;Other (comment) (maintaining current level of precautions for pacemaker) PT Goal: Supine/Side to Sit - Progress: Goal set today Pt will go Sit to Stand: with modified independence (aintaining current level of precautions for pacemaker) PT Goal: Sit to Stand - Progress: Goal set today Pt will go Stand to Sit: with modified independence (aintaining current level of precautions for pacemaker) PT Goal: Stand to Sit - Progress: Goal set today Pt will Stand: with modified independence;3 - 5 min;with unilateral upper extremity support PT Goal: Stand - Progress: Goal set today Pt will Ambulate: >150 feet;with modified independence PT Goal: Ambulate - Progress: Goal set today Pt will Go Up / Down Stairs: 1-2 stairs;with cane;with modified independence PT Goal: Up/Down Stairs - Progress: Goal set today  Visit Information  Last PT Received On: 05/20/12 Assistance Needed: +1    Subjective Data  Subjective: He just won't let us in there when he is bathing to help.  (per grandaughter) Patient Stated Goal: Per family for skilled care to ensure no further infection in pt with poor hygiene   Prior Functioning  Home Living Lives With: Family Available Help at  Discharge: Family;Available PRN/intermittently Type of Home: House Home Access: Stairs to enter [employer] of Steps: 2 Entrance Stairs-Rails: None Home Layout: One level Bathroom Shower/Tub: Engineer, manufacturing systems: Standard Home Adaptive Equipment: Straight cane;Shower chair without back Additional Comments: doesn't use shower chair (was there for pt's spouse now deceased) Prior Function Level of Independence: Needs assistance Needs Assistance: Light Housekeeping;Meal Prep Meal Prep: Total Light Housekeeping: Total Able to Take Stairs?: Yes Driving: No Comments: two grandaughters staying during daytime hours, few hours at night/early morning without supervision.   Communication Communication: No difficulties    Cognition  Cognition Overall Cognitive Status: History of cognitive impairments - at baseline Arousal/Alertness: Awake/alert Orientation Level: Time;Disoriented to Behavior During Session: [facility] for tasks performed Cognition - Other Comments: h/o dementia, stated was Oct 2013, had forgotten history of only living alone short time after wife's demise and that grandaugthers now staying with him; Grandaughter states poor hygiene and concerns for another infection    Extremity/Trunk Assessment Right Lower Extremity Assessment RLE ROM/Strength/Tone: [facility] for tasks assessed Left Lower Extremity Assessment LLE ROM/Strength/Tone: [facility] for tasks assessed   Balance Balance Balance Assessed: Yes Static Standing Balance Static Standing - Balance Support: Right upper extremity supported Static Standing - Level of Assistance: 5: Stand by assistance Static Standing - Comment/# of Minutes: stood for attempting to get O2 Sat; had wall rail to hold right UE  End of Session PT - End of Session Equipment Utilized During Treatment: Gait belt;Other (comment) (left UE sling) Activity Tolerance: Patient tolerated treatment well Patient left: in chair;with call bell/phone  within reach;with nursing in room;with family/visitor present Nurse Communication: Other (comment) (need OT consult)  GP     [facility] 05/20/2012, 10:07 AM Sheran Lawless, PT [phone] 05/20/2012

## 2012-05-20 NOTE — Evaluation (Signed)
Occupational Therapy Evaluation Patient Details Name: Spencer Ramirez MRN: [id number] DOB: [birth year] Today's Date: 05/20/2012 Time: 4098-1191 OT Time Calculation (min): 25 min  OT Assessment / Plan / Recommendation Clinical Impression  Pt admitted with pacemaker infection s/p removal and placement of temporary permanent pacemaker.  Pt presenting close to baseline with functional mobility but does not maintain LUE activity limitations during ADLs and mobility. Also requiring cueing for proper hygiene. Recommending ST SNF to further progress rehab before eventual return home with family.    OT Assessment  Patient needs continued OT Services    Follow Up Recommendations  SNF    Barriers to Discharge      Equipment Recommendations  None recommended by OT    Recommendations for Other Services    Frequency  Min 2X/week    Precautions / Restrictions     Pertinent Vitals/Pain See vitals    ADL  Grooming: Performed;Wash/dry hands;Min guard Where Assessed - Grooming: Unsupported standing Upper Body Bathing: Simulated;Supervision/safety Where Assessed - Upper Body Bathing: Unsupported sitting Lower Body Bathing: Simulated;Min guard Where Assessed - Lower Body Bathing: Unsupported sit to stand Upper Body Dressing: Performed;Minimal assistance Where Assessed - Upper Body Dressing: Unsupported sitting Lower Body Dressing: Performed;Min guard Where Assessed - Lower Body Dressing: Unsupported sit to stand Toilet Transfer: Performed;Min guard Toilet Transfer Method: Sit to Barista: Regular height toilet;Grab bars Toileting - Clothing Manipulation and Hygiene: Performed;Minimal assistance Where Assessed - Engineer, mining and Hygiene: Sit to stand from 3-in-1 or toilet Equipment Used: Gait belt (LUE sling) Transfers/Ambulation Related to ADLs: min guard with HHA to simulate cane in right hand ADL Comments: Grandaughters present and reports that pt  demonstrates poor hygiene skills at home and will often not let them assist with bathing/dressing.  Pt attempting to use LUE for all tasks (reaching for things, pushing up from recliner) despite OT cues to not use LUE in order to maintain activity restrictions for pacemaker.  While washing hands at sink, pt required cueing to use soap and wash hands thoroughly.  Pt also requiring assist to manage gown while in bathroom to avoid getting clothing wet in toilet.    OT Diagnosis: Generalized weakness;Cognitive deficits  OT Problem List: Decreased strength;Decreased activity tolerance;Impaired balance (sitting and/or standing);Decreased cognition;Decreased knowledge of precautions OT Treatment Interventions: Self-care/ADL training;DME and/or AE instruction;Therapeutic activities;Patient/family education;Balance training;Cognitive remediation/compensation   OT Goals Acute Rehab OT Goals OT Goal Formulation: With patient/family Time For Goal Achievement: 05/27/12 Potential to Achieve Goals: Good ADL Goals Pt Will Perform Grooming: with modified independence;Standing at sink ADL Goal: Grooming - Progress: Goal set today Pt Will Perform Upper Body Dressing: with modified independence;Sitting, chair;Sitting, bed (maintaining current pacemaker restrictions with 1 verbal cue) ADL Goal: Upper Body Dressing - Progress: Goal set today Pt Will Perform Lower Body Dressing: with modified independence;Sit to stand from chair;Sit to stand from bed (maintaining current pacemaker restrictions with 1 verbal cue) ADL Goal: Lower Body Dressing - Progress: Goal set today Pt Will Transfer to Toilet: with modified independence;with DME;Ambulation;Regular height toilet ADL Goal: Toilet Transfer - Progress: Goal set today Pt Will Perform Toileting - Clothing Manipulation: with modified independence;Sitting on 3-in-1 or toilet;Standing ADL Goal: Toileting - Clothing Manipulation - Progress: Goal set today Pt Will Perform  Toileting - Hygiene: with modified independence;Sit to stand from 3-in-1/toilet ADL Goal: Toileting - Hygiene - Progress: Goal set today  Visit Information  Last OT Received On: 05/20/12 Assistance Needed: +1    Subjective Data  Prior Functioning     Home Living Lives With: Family Available Help at Discharge: Family;Available PRN/intermittently Type of Home: House Home Access: Stairs to enter [employer] of Steps: 2 Entrance Stairs-Rails: None Home Layout: One level Bathroom Shower/Tub: Engineer, manufacturing systems: Standard Home Adaptive Equipment: Straight cane;Shower chair without back Additional Comments: doesn't use shower chair (was there for pt's spouse now deceased) Prior Function Level of Independence: Needs assistance Needs Assistance: Light Housekeeping;Meal Prep Meal Prep: Total Light Housekeeping: Total Able to Take Stairs?: Yes Driving: No Comments: two grandaughters staying during daytime hours, few hours at night/early morning without supervision.   Communication Communication: No difficulties Dominant Hand: Right         Vision/Perception Vision - History Patient Visual Report: No change from baseline   Cognition  Cognition Overall Cognitive Status: History of cognitive impairments - at baseline Arousal/Alertness: Awake/alert Orientation Level: Time;Disoriented to Behavior During Session: [facility] for tasks performed Cognition - Other Comments: h/o dementia, stated was Oct 2013, had forgotten history of only living alone short time after wife's demise and that grandaugthers now staying with him; Grandaughter states poor hygiene and concerns for another infection    Extremity/Trunk Assessment Right Upper Extremity Assessment RUE ROM/Strength/Tone: WFL for tasks assessed RUE Sensation: WFL - Light Touch;WFL - Proprioception RUE Coordination: WFL - gross/fine motor Left Upper Extremity Assessment LUE ROM/Strength/Tone: WFL for  tasks assessed;Deficits;Due to precautions LUE ROM/Strength/Tone Deficits: pacemaker activity limitations     Mobility Bed Mobility Bed Mobility: Not assessed (pt up in chair) Transfers Transfers: Sit to Stand;Stand to Sit Sit to Stand: 5: Supervision;From chair/3-in-1;From toilet (with RUE assist) Stand to Sit: 5: Supervision;To chair/3-in-1;To toilet (with RUE assist) Details for Transfer Assistance: cues to not use left UE     Exercise     Balance Balance Balance Assessed: Yes Static Standing Balance Static Standing - Balance Support: Right upper extremity supported Static Standing - Level of Assistance: 5: Stand by assistance Dynamic Standing Balance Dynamic Standing - Balance Support: No upper extremity supported;During functional activity Dynamic Standing - Level of Assistance: 5: Stand by assistance;4: Min assist Dynamic Standing - Balance Activities: Forward lean/weight shifting;Lateral lean/weight shifting;Reaching for objects Dynamic Standing - Comments: close guarding while reaching for soap and paper towel during grooming task at sink.   End of Session OT - End of Session Equipment Utilized During Treatment: Gait belt Activity Tolerance: Patient tolerated treatment well Patient left: in chair;with call bell/phone within reach;with family/visitor present  GO   05/20/2012 Cipriano Mile OTR/L Pager [phone] Office [phone]   Cipriano Mile 05/20/2012, 3:12 PM

## 2012-05-21 DIAGNOSIS — I495 Sick sinus syndrome: Secondary | ICD-10-CM

## 2012-05-21 MED ORDER — WARFARIN SODIUM 7.5 MG PO TABS
7.5000 mg | ORAL_TABLET | Freq: Once | ORAL | Status: AC
  Administered 2012-05-21: 7.5 mg via ORAL
  Filled 2012-05-21: qty 1

## 2012-05-21 MED ORDER — HYDROXYZINE HCL 10 MG PO TABS
10.0000 mg | ORAL_TABLET | Freq: Four times a day (QID) | ORAL | Status: DC | PRN
  Administered 2012-05-21: 10 mg via ORAL
  Filled 2012-05-21 (×2): qty 1

## 2012-05-21 NOTE — Progress Notes (Signed)
ANTICOAGULATION CONSULT NOTE - Follow Up Consult  Pharmacy Consult for Warfarin Indication: atrial fibrillation  No Known Allergies  Patient Measurements: Height: 5\' 6"  (167.6 cm) Weight: 167 lb 1.7 oz (75.8 kg) IBW/kg (Calculated) : 63.8  Vital Signs: Temp: 98.1 F (36.7 C) (04/05 0510) Temp src: Oral (04/05 0510) BP: 92/64 mmHg (04/05 0620) Pulse Rate: 72 (04/05 0620)  Labs:  Recent Labs  05/19/12 0615 05/20/12 0647 05/21/12 0505  LABPROT 15.1 15.1 15.0  INR 1.21 1.21 1.20    Estimated Creatinine Clearance: 35.8 ml/min (by C-G formula based on Cr of 1.51).   Medications:  Scheduled:  . carvedilol  6.25 mg Oral BID WC  . furosemide  80 mg Oral Daily  . lisinopril  5 mg Oral Daily  . potassium chloride  20 mEq Oral Daily  . vancomycin  1,000 mg Intravenous Q24H  . [COMPLETED] warfarin  10 mg Oral ONCE-1800  . Warfarin - Pharmacist Dosing Inpatient   Does not apply q1800  . [DISCONTINUED] vancomycin  1,250 mg Intravenous Q24H    Assessment: 79 y/o M on chronic warfarin therapy for a-fib. INR on admit was sub-therapeutic at 1.81 on 4/1. Since re-starting warfarin on 4/3, INR has been 1.20<1.21. No CBC since 4/1. No evidence of overt bleeding noted. CrCl from 4/1 was ~ 15ml/min.   PTA warfarin dose: 5mg  on Mon/Fri and 7.5mg  all other days  Goal of Therapy:  INR 2-3 Monitor platelets by anticoagulation protocol: Yes   Plan:  -Warfarin 7.5mg  PO x 1 tonight at 1800 -Daily PT/INR -Monitor for bleeding  Abran Duke, PharmD Clinical Pharmacist Phone: [phone] Pager: [phone] 05/21/2012 7:28 AM

## 2012-05-21 NOTE — Progress Notes (Signed)
Pt's BP=89/56 HR=73 pt resting comfortably in bed. MD notified, no new orders made at this time. Will continue to monitor.

## 2012-05-21 NOTE — Progress Notes (Signed)
Rechecked pt's BP=92/64 HR=72, Coreg held per DR. Hochrein.

## 2012-05-21 NOTE — Clinical Social Work Note (Signed)
CSW called Revonda Standard at [facility] to tell her MD working on discharge now. Revonda Standard stating not able to accept patient today because she has not received d/c summary by 10:30am.  Ricke Hey, LCSWA [phone] (weekend)

## 2012-05-21 NOTE — Progress Notes (Signed)
   SUBJECTIVE: The patient is doing well today.  At this time, he denies chest pain, shortness of breath, or any new concerns.  . carvedilol  6.25 mg Oral BID WC  . furosemide  80 mg Oral Daily  . lisinopril  5 mg Oral Daily  . potassium chloride  20 mEq Oral Daily  . vancomycin  1,000 mg Intravenous Q24H  . warfarin  7.5 mg Oral ONCE-1800  . Warfarin - Pharmacist Dosing Inpatient   Does not apply q1800      OBJECTIVE: Physical Exam: Filed Vitals:   05/21/12 0054 05/21/12 0510 05/21/12 0620 05/21/12 0913  BP: 94/78 89/56 92/64  90/56  Pulse: 81 73 72   Temp:  98.1 F (36.7 C)    TempSrc:  Oral    Resp:  16    Height:      Weight:  167 lb 1.7 oz (75.8 kg)    SpO2:  96%      Intake/Output Summary (Last 24 hours) at 05/21/12 1019 Last data filed at 05/21/12 0900  Gross per 24 hour  Intake   1080 ml  Output    450 ml  Net    630 ml    Telemetry reveals sinus rhythm  GEN- The patient is well appearing, alert and oriented x 3 today.   Head- normocephalic, atraumatic Eyes-  Sclera clear, conjunctiva pink Ears- hearing intact Oropharynx- clear Neck- supple, no JVP Lymph- no cervical lymphadenopathy Lungs- Clear to ausculation bilaterally, normal work of breathing Heart- Regular rate and rhythm, no murmurs, rubs or gallops, PMI not laterally displaced GI- soft, NT, ND, + BS Extremities- no clubbing, cyanosis, or edema Wound- modest prurlent drainage expressed, redressed today  RADIOLOGY: Dg Chest 2 View  05/19/2012  *RADIOLOGY REPORT*  Clinical Data: Status post temporary pacemaker placement.  CHEST - 2 VIEW  Comparison: Chest x-ray 03/26/2012.  Findings: Revision of the previously noted pacemaker, now with a single lead which appears to terminate in the right ventricular apex.  The left-sided pacemaker is in a high position projecting over the left clavicle.  No definite pneumothorax.  Lung volumes are slightly low.  No consolidative airspace disease.  No pleural  effusions.  Pulmonary vasculature and the cardiomediastinal silhouette are within normal limits.  Atherosclerosis in the thoracic aorta.  IMPRESSION: 1.  Status post revision of pacemaker placement, as above, without acute complicating features. 2.  Atherosclerosis.   Original Report Authenticated By: Trudie Reed, M.D.     ASSESSMENT AND PLAN:  Active Problems:   Sick sinus syndrome   S/P placement of cardiac pacemaker   Infection and inflammatory reaction due to cardiac device, implant, and graft  1. SSS Temp/perm pacer in place  2. Pacemaker pocket infection Wound redressed today Will continue on antibiotics over the weekend.   Disp: [facility] cannot take patient today.  Will therefore remain with [place] over the weekend.   Hillis Range, MD 05/21/2012 10:19 AM

## 2012-05-21 NOTE — Progress Notes (Signed)
bp 78/56. Py asymptomatic. md made aware.

## 2012-05-22 MED ORDER — WARFARIN SODIUM 7.5 MG PO TABS
7.5000 mg | ORAL_TABLET | Freq: Once | ORAL | Status: AC
  Administered 2012-05-22: 7.5 mg via ORAL
  Filled 2012-05-22: qty 1

## 2012-05-22 NOTE — Progress Notes (Signed)
   SUBJECTIVE: The patient is doing well today.  At this time, he denies chest pain, shortness of breath, or any new concerns.  . carvedilol  6.25 mg Oral BID WC  . furosemide  80 mg Oral Daily  . lisinopril  5 mg Oral Daily  . potassium chloride  20 mEq Oral Daily  . vancomycin  1,000 mg Intravenous Q24H  . warfarin  7.5 mg Oral ONCE-1800  . Warfarin - Pharmacist Dosing Inpatient   Does not apply q1800      OBJECTIVE: Physical Exam: Filed Vitals:   05/21/12 1600 05/21/12 2041 05/22/12 0018 05/22/12 0626  BP: 84/56 84/51 91/55  88/45  Pulse:  61 65 78  Temp:  97.9 F (36.6 C)  98.1 F (36.7 C)  TempSrc:  Oral  Oral  Resp:  18  20  Height:      Weight:    168 lb 6.9 oz (76.4 kg)  SpO2:  99%  96%    Intake/Output Summary (Last 24 hours) at 05/22/12 0946 Last data filed at 05/22/12 0930  Gross per 24 hour  Intake    660 ml  Output    550 ml  Net    110 ml    Telemetry reveals sinus rhythm with intermittent demand V pacing/ junctional rhythm  GEN- The patient is well appearing, alert and oriented x 3 today.   Head- normocephalic, atraumatic Eyes-  Sclera clear, conjunctiva pink Ears- hearing intact Oropharynx- clear Neck- supple, no JVP Lymph- no cervical lymphadenopathy Lungs- Clear to ausculation bilaterally, normal work of breathing Heart- Regular rate and rhythm, no murmurs, rubs or gallops, PMI not laterally displaced GI- soft, NT, ND, + BS Extremities- no clubbing, cyanosis, or edema Wound- modest prurlent drainage expressed, redressed today by MD  RADIOLOGY: Dg Chest 2 View  05/19/2012  *RADIOLOGY REPORT*  Clinical Data: Status post temporary pacemaker placement.  CHEST - 2 VIEW  Comparison: Chest x-ray 03/26/2012.  Findings: Revision of the previously noted pacemaker, now with a single lead which appears to terminate in the right ventricular apex.  The left-sided pacemaker is in a high position projecting over the left clavicle.  No definite pneumothorax.   Lung volumes are slightly low.  No consolidative airspace disease.  No pleural effusions.  Pulmonary vasculature and the cardiomediastinal silhouette are within normal limits.  Atherosclerosis in the thoracic aorta.  IMPRESSION: 1.  Status post revision of pacemaker placement, as above, without acute complicating features. 2.  Atherosclerosis.   Original Report Authenticated By: Trudie Reed, M.D.     ASSESSMENT AND PLAN:  Active Problems:   Sick sinus syndrome   S/P placement of cardiac pacemaker   Infection and inflammatory reaction due to cardiac device, implant, and graft  1. SSS Temp/perm pacer in place  2. Pacemaker pocket infection Wound redressed today Will continue on antibiotics over the weekend.   3. Diastolic dysfunction/ chronic renal insufficiency euvolemic today Hold lasix  Disp: [facility] cannot take patient this weekend.  Will therefore remain with [place] over the weekend.   Hillis Range, MD 05/22/2012 9:46 AM

## 2012-05-22 NOTE — Progress Notes (Signed)
ANTICOAGULATION CONSULT NOTE - Follow Up Consult  Pharmacy Consult for Warfarin Indication: atrial fibrillation  No Known Allergies  Patient Measurements: Height: 5\' 6"  (167.6 cm) Weight: 168 lb 6.9 oz (76.4 kg) IBW/kg (Calculated) : 63.8  Vital Signs: Temp: 98.1 F (36.7 C) (04/06 0626) Temp src: Oral (04/06 0626) BP: 88/45 mmHg (04/06 0626) Pulse Rate: 78 (04/06 0626)  Labs:  Recent Labs  05/20/12 0647 05/21/12 0505 05/22/12 0450  LABPROT 15.1 15.0 16.7*  INR 1.21 1.20 1.39    Estimated Creatinine Clearance: 35.8 ml/min (by C-G formula based on Cr of 1.51).   Medications:  Scheduled:  . carvedilol  6.25 mg Oral BID WC  . furosemide  80 mg Oral Daily  . lisinopril  5 mg Oral Daily  . potassium chloride  20 mEq Oral Daily  . vancomycin  1,000 mg Intravenous Q24H  . [COMPLETED] warfarin  7.5 mg Oral ONCE-1800  . Warfarin - Pharmacist Dosing Inpatient   Does not apply q1800    Assessment: 79 y/o M on chronic warfarin therapy for a-fib. INR on admit was sub-therapeutic at 1.81 on 4/1. Since re-starting warfarin on 4/3, INR has been 1.39<1.20<1.21. No CBC since 4/1. No evidence of overt bleeding noted. CrCl from 4/1 was ~ 69ml/min.   PTA warfarin dose: 5mg  on Mon/Fri and 7.5mg  all other days  Goal of Therapy:  INR 2-3 Monitor platelets by anticoagulation protocol: Yes   Plan:  -Warfarin 7.5mg  PO x 1 tonight at 1800 -Daily PT/INR -Monitor for bleeding  Abran Duke, PharmD Clinical Pharmacist Phone: [phone] Pager: [phone] 05/22/2012 7:49 AM

## 2012-05-23 DIAGNOSIS — N189 Chronic kidney disease, unspecified: Secondary | ICD-10-CM

## 2012-05-23 DIAGNOSIS — I5030 Unspecified diastolic (congestive) heart failure: Secondary | ICD-10-CM

## 2012-05-23 LAB — PROTIME-INR: INR: 1.49 (ref 0.00–1.49)

## 2012-05-23 MED ORDER — POTASSIUM CHLORIDE CRYS ER 20 MEQ PO TBCR: 20.0000 meq | EXTENDED_RELEASE_TABLET | Freq: Every day | ORAL | Status: AC

## 2012-05-23 MED ORDER — CEPHALEXIN 500 MG PO CAPS: 500.0000 mg | ORAL_CAPSULE | Freq: Two times a day (BID) | ORAL | Status: DC

## 2012-05-23 MED ORDER — WARFARIN SODIUM 7.5 MG PO TABS
7.5000 mg | ORAL_TABLET | Freq: Once | ORAL | Status: DC
  Filled 2012-05-23: qty 1

## 2012-05-23 NOTE — Progress Notes (Signed)
I have reviewed and agree with this note. Cathy Savanna Dooley, OTR/L [phone] 05/23/2012   

## 2012-05-23 NOTE — Progress Notes (Signed)
ANTICOAGULATION CONSULT NOTE - Follow Up Consult  Pharmacy Consult for Warfarin Indication: atrial fibrillation  No Known Allergies  Labs:  Recent Labs  05/21/12 0505 05/22/12 0450 05/23/12 0514  LABPROT 15.0 16.7* 17.6*  INR 1.20 1.39 1.49    Estimated Creatinine Clearance: 38.8 ml/min (by C-G formula based on Cr of 1.51).  Assessment: 79 y/o M on chronic warfarin therapy for a-fib. INR on admit was sub-therapeutic at 1.81 on 4/1. Since re-starting warfarin on 4/3, INR has been 1.49<1.39<1.20<1.21. No CBC since 4/1. No evidence of overt bleeding noted. CrCl from 4/1 was ~ 60ml/min.   PTA warfarin dose: 5mg  on Mon/Fri and 7.5mg  all other days  Goal of Therapy:  INR 2-3 Monitor platelets by anticoagulation protocol: Yes   Plan:  -Warfarin 7.5mg  PO x 1 prior to dc -Daily PT/INR -Recommend Coumadin 7.5 mg daily except 5 mg on Fridays  Thank you. Okey Regal, PharmD [phone]  05/23/2012 8:21 AM

## 2012-05-23 NOTE — Progress Notes (Signed)
Occupational Therapy Treatment Patient Details Name: Spencer Ramirez MRN: [id number] DOB: 1930/12/01 Today's Date: 05/23/2012 Time: 1130-1200 OT Time Calculation (min): 30 min  OT Assessment / Plan / Recommendation Comments on Treatment Session Pt was alert and did not appear confused during session/ Pt was able to ambulate with min guard to obtain 4 point cane.  While at sink pt was able to reach out of BOS to obtain items needed.  Pt did forget to obtain cane when ambulating back to chair needed to be cued by OTA/S. Did know he had precautions but not correct  precautions for day 4.    Follow Up Recommendations  SNF       Equipment Recommendations  None recommended by OT       Frequency Min 2X/week   Plan Discharge plan remains appropriate    Precautions / Restrictions Precautions Precautions: Fall;ICD/Pacemaker Required Braces or Orthoses: Other Brace/Splint Other Brace/Splint: sling left UE--can have off if someone supervising him Restrictions Weight Bearing Restrictions: No   Pertinent Vitals/Pain No denies of pain    ADL  Grooming: Performed;Wash/dry hands;Wash/dry face;Min guard;Shaving Where Assessed - Grooming: Supported standing Toilet Transfer: Counsellor Method: Sit to Barista:  (sit to stand from recliner, to sink, back to recliner) Equipment Used: Gait belt (quad cane) Transfers/Ambulation Related to ADLs: Stood from recliner with correct hand placement and min A and ambulated to obtain 4 point cane with minguard A.  Ambulated to sink with no loss of balance until leaving the sink when pt forgot to use cane, had to be reminded to get cane ADL Comments: Pt was able to complete grooming at sink while standing.  Needed VCs to obtain soap and place top back on the shaving cream.      OT Goals ADL Goals ADL Goal: Grooming - Progress: Progressing toward goals ADL Goal: Toilet Transfer - Progress: Not progressing (same  as eval)  Visit Information  Last OT Received On: 05/23/12 Assistance Needed: +1    Subjective Data  Subjective: I can only bring my hand up to here (pointing to shoulder)      Cognition  Cognition Overall Cognitive Status: History of cognitive impairments - at baseline Arousal/Alertness: Awake/alert Orientation Level: Appears intact for tasks assessed Behavior During Session: [facility] for tasks performed    Mobility  Bed Mobility Details for Bed Mobility Assistance: Was in chair upon arrival Transfers Transfers: Sit to Stand;Stand to Sit Sit to Stand: With armrests;With upper extremity assist;From chair/3-in-1;4: Min assist Stand to Sit: 4: Min guard;To chair/3-in-1;With armrests;With upper extremity assist Details for Transfer Assistance: Needed min cues during transfer. Did not want to use RW however did use 4 point cane       Balance Balance Balance Assessed: Yes Static Standing Balance Static Standing - Balance Support: During functional activity;Right upper extremity supported Static Standing - Level of Assistance:  (min guard) Static Standing - Comment/# of Minutes: 7 minutes at sink while shaving Dynamic Standing Balance Dynamic Standing - Balance Support: No upper extremity supported;During functional activity Dynamic Standing - Level of Assistance:  (min guard) Dynamic Standing - Balance Activities: Lateral lean/weight shifting;Reaching for weighted objects Dynamic Standing - Comments: min guard while reaching fro soap and washcloth   End of Session OT - End of Session Equipment Utilized During Treatment: Gait belt (4 point cane) Activity Tolerance: Patient tolerated treatment well Patient left: in chair;with call bell/phone within reach;with family/visitor present       Dietrich Pates 05/23/2012,  3:59 PM

## 2012-05-23 NOTE — Progress Notes (Signed)
     Patient: Spencer Ramirez Date of Encounter: 05/23/2012, 7:39 AM Admit date: 05/16/2012     Subjective  Mr. Deeley has no complaints this AM. He denies CP or SOB. He is waiting for SNF.   Objective  Physical Exam: Vitals: BP 128/76  Pulse 75  Temp(Src) 97.6 F (36.4 C) (Oral)  Resp 19  Ht 5\' 6"  (1.676 m)  Wt 169 lb 15.6 oz (77.1 kg)  BMI 27.45 kg/m2  SpO2 97% General: Well developed 79 year old male in no acute distress. Neck: Supple. JVD not elevated. Lungs: Clear bilaterally to auscultation without wheezes, rales, or rhonchi. Breathing is unlabored. Heart: RRR S1 S2 without murmurs, rubs, or gallops.  Abdomen: Soft, non-distended. Extremities: No clubbing or cyanosis. No edema.  Distal pedal pulses are 2+ and equal bilaterally. Neuro: Alert and oriented X 3. Moves all extremities spontaneously. No focal deficits.  Intake/Output:  Intake/Output Summary (Last 24 hours) at 05/23/12 0739 Last data filed at 05/23/12 0615  Gross per 24 hour  Intake    920 ml  Output    350 ml  Net    570 ml    Inpatient Medications:  . carvedilol  6.25 mg Oral BID WC  . furosemide  80 mg Oral Daily  . lisinopril  5 mg Oral Daily  . potassium chloride  20 mEq Oral Daily  . vancomycin  1,000 mg Intravenous Q24H  . Warfarin - Pharmacist Dosing Inpatient   Does not apply q1800    Labs: No results found for this basename: NA, K, CL, CO2, GLUCOSE, BUN, CREATININE, CALCIUM, MG, PHOS,  in the last 72 hours No results found for this basename: WBC, NEUTROABS, HGB, HCT, MCV, PLT,  in the last 72 hours  Recent Labs  05/23/12 0514  INR 1.49    Radiology/Studies: Dg Chest 2 View  05/19/2012  *RADIOLOGY REPORT*  Clinical Data: Status post temporary pacemaker placement.  CHEST - 2 VIEW  Comparison: Chest x-ray 03/26/2012.  Findings: Revision of the previously noted pacemaker, now with a single lead which appears to terminate in the right ventricular apex.  The left-sided pacemaker is in a high  position projecting over the left clavicle.  No definite pneumothorax.  Lung volumes are slightly low.  No consolidative airspace disease.  No pleural effusions.  Pulmonary vasculature and the cardiomediastinal silhouette are within normal limits.  Atherosclerosis in the thoracic aorta.  IMPRESSION: 1.  Status post revision of pacemaker placement, as above, without acute complicating features. 2.  Atherosclerosis.   Original Report Authenticated By: Trudie Reed, M.D.     Telemetry: SR; no arrhythmias    Assessment and Plan  1. SSS  Temp/perm pacer in place  2. Pacemaker pocket infection  Wound redressed yesterday Will continue on antibiotics - per Dr. Johney Frame yesterday, may switch to PO Keflex at DC 3. Diastolic dysfunction  Euvolemic today  4. Chronic renal insufficiency Lasix held yesterday  Plan for DC to [facility] today. Dr. Ladona Ridgel to see Signed, Exie Parody  EP Attending  Patient seen and examined. Agree with above. Ok for discharge. Followup in 10 days in office for incision check.   [name].D.

## 2012-05-23 NOTE — Progress Notes (Signed)
Patient's IV and telemetry has been discontinued, patient's son verbalizes understanding of discharge instructions and will be discharged to [facility].

## 2012-05-23 NOTE — Clinical Social Work Placement (Signed)
     Clinical Social Work Department CLINICAL SOCIAL WORK PLACEMENT NOTE 05/23/2012  Patient:  Spencer Ramirez, Spencer Ramirez  Account Number:  [id number] Admit date:  05/16/2012  Clinical Social Worker:  Read Drivers  Date/time:  05/20/2012 07:52 AM  Clinical Social Work is seeking post-discharge placement for this patient at the following level of care:   SKILLED NURSING   (*CSW will update this form in Epic as items are completed)   05/20/2012  Patient/family provided with [facility] Department of Clinical Social Works list of facilities offering this level of care within the geographic area requested by the patient (or if unable, by the patients family).  05/20/2012  Patient/family informed of their freedom to choose among providers that offer the needed level of care, that participate in Medicare, Medicaid or managed care program needed by the patient, have an available bed and are willing to accept the patient.  05/20/2012  Patient/family informed of [facility] ownership interest in [facility], as well as of the fact that they are under no obligation to receive care at this facility.  PASARR submitted to EDS on 05/20/2012 PASARR number received from EDS on 05/20/2012  FL2 transmitted to all facilities in geographic area requested by pt/family on  05/20/2012 FL2 transmitted to all facilities within larger geographic area on   Patient informed that his/her managed care company has contracts with or will negotiate with  certain facilities, including the following:   NA     Patient/family informed of bed offers received:  05/20/2012 Patient chooses bed at [facility], [place] Physician recommends and patient chooses bed at    [facility] be [facility] [place] on  05/23/2012 Patient to be transferred to facility by Ambulance  Sharin Mons)  The following physician request were entered in Epic:   Additional Comments: 05/24/11  Patient was scheduled for d/c to SNF on Sat 05/22/11; Admissions Director stated that pateint had to be there by noon.  MD was completing d/c paperwork by 10:30- CSW called SNF and was told he would get there too late.  Thus- d/c was delayed until today.  Ok per MD, patient, SNF and family for d/c. today to [facility].  Notified pt's nurse of d/c plan.  No further CSW needs identified.  CSW signing off.  Lorri Frederick.Johann Santone, LCSWA  [phone]

## 2012-05-23 NOTE — Progress Notes (Signed)
Physical Therapy Treatment Note   05/23/12 0907  PT Visit Information  Last PT Received On 05/23/12  Assistance Needed +1  PT Time Calculation  PT Start Time 0907  PT Stop Time 0925  PT Time Calculation (min) 18 min  Subjective Data  Subjective Pt received supine in bed agreeable to PT.  Precautions  Precautions Fall;ICD/Pacemaker  Precaution Comments pt with poor recall  Restrictions  Weight Bearing Restrictions No  Cognition  Overall Cognitive Status History of cognitive impairments - at baseline  Arousal/Alertness Awake/alert  Orientation Level Time;Disoriented to  Behavior During Session [facility] for tasks performed  Bed Mobility  Bed Mobility Supine to Sit  Supine to Sit 5: Supervision  Transfers  Transfers Sit to Stand;Stand to Sit  Sit to Stand 4: Min guard;With upper extremity assist;From bed  Stand to Sit 5: Supervision;To chair/3-in-1;To toilet  Details for Transfer Assistance sling donned on L UE to remind pt to not use L UE  Ambulation/Gait  Ambulation/Gait Assistance 4: Min guard  Ambulation Distance (Feet) 200 Feet  Assistive device None  Ambulation/Gait Assistance Details mildly unsteady, increased trunk flexion however can achieve upright posture with verbal cues  Gait Pattern Step-through pattern;Decreased stride length;Trunk flexed;Shuffle  Gait velocity decr  Stairs Yes  Stairs Assistance 4: Min guard  Stair Management Technique One rail Right;Forwards;Step to pattern  Number of Stairs 12  PT - End of Session  Equipment Utilized During Treatment Gait belt  Activity Tolerance Patient tolerated treatment well  Patient left in chair;with call bell/phone within reach  Nurse Communication Mobility status  PT - Assessment/Plan  Comments on Treatment Session Pt mobilizing well but remains to have cognitive deficits that inhibit patients ability to return home alone due to patients inability to properly care for self. Pt to con't ot benefit from SNF upon d/c from  hospital.  PT Plan Discharge plan remains appropriate;Frequency remains appropriate  PT Frequency Min 3X/week  Follow Up Recommendations SNF  PT equipment None recommended by PT  Acute Rehab PT Goals  PT Goal: Supine/Side to Sit - Progress Progressing toward goal  PT Goal: Sit to Stand - Progress Progressing toward goal  PT Goal: Stand to Sit - Progress Progressing toward goal  PT Goal: Ambulate - Progress Progressing toward goal  PT Goal: Up/Down Stairs - Progress Progressing toward goal  PT General Charges  $$ ACUTE PT VISIT 1 Procedure  PT Treatments  $Gait Training 8-22 mins   Pain: denies  Lewis Shock, PT, DPT Pager #: [phone] Office #: [phone]

## 2012-06-01 ENCOUNTER — Telehealth: Payer: Self-pay | Admitting: Internal Medicine

## 2012-06-01 NOTE — Telephone Encounter (Signed)
New problem    Nurse calling to speak to dr taylor or his nurse/xfer'd call to [facility]

## 2012-06-01 NOTE — Telephone Encounter (Signed)
Spoke with the [facility] and also the nurse at Dr Jeannetta Nap office.  I have asked that Dr Jeannetta Nap write the orders for the home health to cover his Coumadin.  He is having his wound checked tomorrow and we will let the [facility] know if there are any changes Will be set up for reimplantation with Dr Ladona Ridgel soon

## 2012-06-02 ENCOUNTER — Ambulatory Visit (INDEPENDENT_AMBULATORY_CARE_PROVIDER_SITE_OTHER): Payer: Medicare Other | Admitting: *Deleted

## 2012-06-02 ENCOUNTER — Other Ambulatory Visit: Payer: Self-pay | Admitting: Internal Medicine

## 2012-06-02 DIAGNOSIS — I428 Other cardiomyopathies: Secondary | ICD-10-CM

## 2012-06-02 DIAGNOSIS — I4892 Unspecified atrial flutter: Secondary | ICD-10-CM

## 2012-06-02 DIAGNOSIS — I429 Cardiomyopathy, unspecified: Secondary | ICD-10-CM

## 2012-06-02 LAB — PACEMAKER DEVICE OBSERVATION
BATTERY VOLTAGE: 2.79 V
BMOD-0003RV: 30
BMOD-0005RV: 95 {beats}/min
BRDY-0002RV: 40 {beats}/min
RV LEAD IMPEDENCE PM: 649 Ohm

## 2012-06-02 NOTE — Progress Notes (Signed)
Wound check-PPM.  Every other retention suture removed and steri strips applied.  Site redressed.

## 2012-06-07 NOTE — Telephone Encounter (Signed)
Follow Up      Following up on information about getting pacemaker put in for pt. Would like to speak to nurse.

## 2012-06-07 NOTE — Telephone Encounter (Signed)
Per Aundra Millet - pts grandaughter - pt was told pacer would be replaced in 2 to 3 weeks and they have not been contacted.  She is aware I will forward this information to Dennis Bast, RN to follow up and schedule when due.  She is agreeable and will wait for a call.

## 2012-06-08 NOTE — Telephone Encounter (Signed)
Moved appointment to 06/16/12

## 2012-06-15 ENCOUNTER — Telehealth: Payer: Self-pay | Admitting: Internal Medicine

## 2012-06-15 NOTE — Telephone Encounter (Signed)
Pt's grandaughter Aundra Millet calling re questions on wound care

## 2012-06-15 NOTE — Telephone Encounter (Signed)
Patient granddaughter called and he has an appointment tomorrow.  He is afebrile at present and will keep site covered until tomorrow until his follow up to have this redressed.

## 2012-06-16 ENCOUNTER — Encounter: Payer: Self-pay | Admitting: *Deleted

## 2012-06-16 ENCOUNTER — Ambulatory Visit (INDEPENDENT_AMBULATORY_CARE_PROVIDER_SITE_OTHER): Payer: Medicare Other | Admitting: Internal Medicine

## 2012-06-16 ENCOUNTER — Encounter: Payer: Self-pay | Admitting: Internal Medicine

## 2012-06-16 VITALS — BP 102/64 | HR 64 | Ht 66.0 in | Wt 174.4 lb

## 2012-06-16 DIAGNOSIS — I4891 Unspecified atrial fibrillation: Secondary | ICD-10-CM

## 2012-06-16 DIAGNOSIS — I495 Sick sinus syndrome: Secondary | ICD-10-CM

## 2012-06-16 LAB — BASIC METABOLIC PANEL
Calcium: 9.1 mg/dL (ref 8.4–10.5)
Creatinine, Ser: 2 mg/dL — ABNORMAL HIGH (ref 0.4–1.5)
GFR: 35.4 mL/min — ABNORMAL LOW (ref >60.00)
Glucose, Bld: 68 mg/dL — ABNORMAL LOW (ref 70–99)
Sodium: 133 mEq/L — ABNORMAL LOW (ref 135–145)

## 2012-06-16 LAB — CBC WITH DIFFERENTIAL/PLATELET
Basophils Absolute: 0.1 10*3/uL (ref 0.0–0.1)
Eosinophils Absolute: 0.7 10*3/uL (ref 0.0–0.7)
Hemoglobin: 13.4 g/dL (ref 13.0–17.0)
Lymphocytes Relative: 27.3 % (ref 12.0–46.0)
MCHC: 33.9 g/dL (ref 30.0–36.0)
Monocytes Relative: 7.6 % (ref 3.0–12.0)
Neutro Abs: 5.5 10*3/uL (ref 1.4–7.7)
Neutrophils Relative %: 57.6 % (ref 43.0–77.0)
RDW: 14.2 % (ref 11.5–14.6)

## 2012-06-16 LAB — PROTIME-INR
INR: 2.4 ratio — ABNORMAL HIGH (ref 0.8–1.0)
Prothrombin Time: 24.5 s — ABNORMAL HIGH (ref 10.2–12.4)

## 2012-06-16 MED ORDER — CEFAZOLIN SODIUM-DEXTROSE 2-3 GM-% IV SOLR
2.0000 g | INTRAVENOUS | Status: DC
  Filled 2012-06-16 (×2): qty 50

## 2012-06-16 MED ORDER — SODIUM CHLORIDE 0.9 % IR SOLN
80.0000 mg | Status: DC
  Filled 2012-06-16: qty 2

## 2012-06-16 NOTE — Patient Instructions (Addendum)

## 2012-06-17 ENCOUNTER — Ambulatory Visit (HOSPITAL_COMMUNITY)
Admission: RE | Admit: 2012-06-17 | Discharge: 2012-06-18 | Disposition: A | Discharge status: Home / Self Care | Payer: Medicare Other | Source: Ambulatory Visit | Attending: Internal Medicine | Admitting: Internal Medicine

## 2012-06-17 ENCOUNTER — Encounter (HOSPITAL_COMMUNITY)
Admission: RE | Disposition: A | Discharge status: Home / Self Care | Payer: Self-pay | Source: Ambulatory Visit | Attending: Internal Medicine

## 2012-06-17 ENCOUNTER — Encounter (HOSPITAL_COMMUNITY): Payer: Self-pay | Admitting: General Practice

## 2012-06-17 DIAGNOSIS — I4892 Unspecified atrial flutter: Secondary | ICD-10-CM

## 2012-06-17 DIAGNOSIS — N189 Chronic kidney disease, unspecified: Secondary | ICD-10-CM | POA: Insufficient documentation

## 2012-06-17 DIAGNOSIS — Z950 Presence of cardiac pacemaker: Secondary | ICD-10-CM

## 2012-06-17 DIAGNOSIS — I872 Venous insufficiency (chronic) (peripheral): Secondary | ICD-10-CM

## 2012-06-17 DIAGNOSIS — I4891 Unspecified atrial fibrillation: Secondary | ICD-10-CM | POA: Insufficient documentation

## 2012-06-17 DIAGNOSIS — F0390 Unspecified dementia without behavioral disturbance: Secondary | ICD-10-CM

## 2012-06-17 DIAGNOSIS — I509 Heart failure, unspecified: Secondary | ICD-10-CM | POA: Insufficient documentation

## 2012-06-17 DIAGNOSIS — I2510 Atherosclerotic heart disease of native coronary artery without angina pectoris: Secondary | ICD-10-CM | POA: Insufficient documentation

## 2012-06-17 DIAGNOSIS — I429 Cardiomyopathy, unspecified: Secondary | ICD-10-CM

## 2012-06-17 DIAGNOSIS — Z7901 Long term (current) use of anticoagulants: Secondary | ICD-10-CM | POA: Insufficient documentation

## 2012-06-17 DIAGNOSIS — I951 Orthostatic hypotension: Secondary | ICD-10-CM

## 2012-06-17 DIAGNOSIS — I495 Sick sinus syndrome: Secondary | ICD-10-CM

## 2012-06-17 DIAGNOSIS — I129 Hypertensive chronic kidney disease with stage 1 through stage 4 chronic kidney disease, or unspecified chronic kidney disease: Secondary | ICD-10-CM | POA: Insufficient documentation

## 2012-06-17 DIAGNOSIS — R55 Syncope and collapse: Secondary | ICD-10-CM

## 2012-06-17 DIAGNOSIS — I5022 Chronic systolic (congestive) heart failure: Secondary | ICD-10-CM | POA: Insufficient documentation

## 2012-06-17 HISTORY — PX: PACEMAKER REVISION: SHX5999

## 2012-06-17 HISTORY — PX: PERMANENT PACEMAKER INSERTION: SHX5480

## 2012-06-17 SURGERY — PERMANENT PACEMAKER INSERTION
Anesthesia: LOCAL

## 2012-06-17 MED ORDER — WARFARIN SODIUM 5 MG PO TABS: 5.0000 mg | ORAL_TABLET | Freq: Every evening | ORAL | Status: DC

## 2012-06-17 MED ORDER — POTASSIUM CHLORIDE CRYS ER 20 MEQ PO TBCR
20.0000 meq | EXTENDED_RELEASE_TABLET | Freq: Every day | ORAL | Status: DC
Start: 2012-06-18 — End: 2012-06-18
  Administered 2012-06-17: 20 meq via ORAL
  Filled 2012-06-17 (×2): qty 1

## 2012-06-17 MED ORDER — CHLORHEXIDINE GLUCONATE 4 % EX LIQD: 60.0000 mL | Freq: Once | CUTANEOUS | Status: DC

## 2012-06-17 MED ORDER — MIDAZOLAM HCL 5 MG/5ML IJ SOLN
INTRAMUSCULAR | Status: AC
  Filled 2012-06-17: qty 5

## 2012-06-17 MED ORDER — LIDOCAINE HCL (PF) 1 % IJ SOLN
INTRAMUSCULAR | Status: AC
  Filled 2012-06-17: qty 60

## 2012-06-17 MED ORDER — CARVEDILOL 6.25 MG PO TABS
6.2500 mg | ORAL_TABLET | Freq: Two times a day (BID) | ORAL | Status: DC
  Administered 2012-06-17 – 2012-06-18 (×2): 6.25 mg via ORAL
  Filled 2012-06-17 (×4): qty 1

## 2012-06-17 MED ORDER — WARFARIN - PHYSICIAN DOSING INPATIENT
Freq: Every day | Status: DC
  Administered 2012-06-17: 18:00:00

## 2012-06-17 MED ORDER — FENTANYL CITRATE 0.05 MG/ML IJ SOLN
INTRAMUSCULAR | Status: AC
  Filled 2012-06-17: qty 2

## 2012-06-17 MED ORDER — WARFARIN SODIUM 7.5 MG PO TABS
7.5000 mg | ORAL_TABLET | ORAL | Status: DC
  Filled 2012-06-17: qty 1

## 2012-06-17 MED ORDER — SODIUM CHLORIDE 0.9 % IJ SOLN
3.0000 mL | INTRAMUSCULAR | Status: DC | PRN
  Administered 2012-06-17: 21:00:00 3 mL via INTRAVENOUS

## 2012-06-17 MED ORDER — SODIUM CHLORIDE 0.9 % IV SOLN: INTRAVENOUS | Status: DC

## 2012-06-17 MED ORDER — CEFAZOLIN SODIUM-DEXTROSE 2-3 GM-% IV SOLR
2.0000 g | Freq: Four times a day (QID) | INTRAVENOUS | Status: AC
  Administered 2012-06-17 – 2012-06-18 (×3): 2 g via INTRAVENOUS
  Filled 2012-06-17 (×4): qty 50

## 2012-06-17 MED ORDER — ACETAMINOPHEN 325 MG PO TABS: 325.0000 mg | ORAL_TABLET | ORAL | Status: DC | PRN

## 2012-06-17 MED ORDER — FUROSEMIDE 80 MG PO TABS
80.0000 mg | ORAL_TABLET | Freq: Every morning | ORAL | Status: DC
  Administered 2012-06-17 – 2012-06-18 (×2): 80 mg via ORAL
  Filled 2012-06-17 (×2): qty 1

## 2012-06-17 MED ORDER — HEPARIN (PORCINE) IN NACL 2-0.9 UNIT/ML-% IJ SOLN
INTRAMUSCULAR | Status: AC
  Filled 2012-06-17: qty 500

## 2012-06-17 MED ORDER — WARFARIN SODIUM 5 MG PO TABS
5.0000 mg | ORAL_TABLET | ORAL | Status: AC
  Administered 2012-06-17: 5 mg via ORAL
  Filled 2012-06-17: qty 1

## 2012-06-17 MED ORDER — YOU HAVE A PACEMAKER BOOK
Freq: Once | Status: AC
  Administered 2012-06-17: 20:00:00
  Filled 2012-06-17: qty 1

## 2012-06-17 MED ORDER — ADULT MULTIVITAMIN W/MINERALS CH
1.0000 | ORAL_TABLET | Freq: Every day | ORAL | Status: DC
  Administered 2012-06-18: 09:00:00 1 via ORAL
  Filled 2012-06-17: qty 1

## 2012-06-17 MED ORDER — MUPIROCIN 2 % EX OINT
TOPICAL_OINTMENT | CUTANEOUS | Status: AC
  Administered 2012-06-17: 10:00:00
  Filled 2012-06-17: qty 22

## 2012-06-17 MED ORDER — LISINOPRIL 5 MG PO TABS
5.0000 mg | ORAL_TABLET | Freq: Every morning | ORAL | Status: DC
  Filled 2012-06-17: qty 1

## 2012-06-17 MED ORDER — CALCIUM CARBONATE 600 MG PO TABS: 600.0000 mg | ORAL_TABLET | Freq: Every day | ORAL | Status: DC

## 2012-06-17 MED ORDER — ONDANSETRON HCL 4 MG/2ML IJ SOLN: 4.0000 mg | Freq: Four times a day (QID) | INTRAMUSCULAR | Status: DC | PRN

## 2012-06-17 MED ORDER — CALCIUM CARBONATE 1250 (500 CA) MG PO TABS
1.0000 | ORAL_TABLET | Freq: Every day | ORAL | Status: DC
  Administered 2012-06-17: 18:00:00 500 mg via ORAL
  Filled 2012-06-17: qty 1

## 2012-06-17 MED ORDER — HYDROXYZINE HCL 10 MG PO TABS
10.0000 mg | ORAL_TABLET | Freq: Four times a day (QID) | ORAL | Status: DC | PRN
  Filled 2012-06-17: qty 1

## 2012-06-17 NOTE — Progress Notes (Signed)
BP 78/42.  Pt asymptomatic, alert, reading book in bed.  Dr Terressa Koyanagi informed, chart reviewed, received lasix and coreg at 1730, EF 30-35%.  No orders received.

## 2012-06-17 NOTE — Progress Notes (Signed)
Alert, speech clear, pleasantly confused.  Bed exit alarm on.  Sling on rt arm, freq reminders not to get OOB or move rt arm given.  BP 86/54, 97/50, MAP>60.  Denies dizziness.  Will continue to monitor.

## 2012-06-17 NOTE — H&P (Signed)
Spencer Ramirez is an 79 y.o. male.   Chief Complaint: I am here for a ppm HPI: 79 yo man with a h/o symptomatic sinus node dysfunction s/p PPM for 9 second pauses developed a pocket infection s/p extraction, now admitted for removal of his temporary perm PPM and insertion of a new device.   Past Medical History  Diagnosis Date  . Chronic systolic CHF (congestive heart failure)   . Cardiomyopathy   . Chronic venous insufficiency   . Chronic kidney disease     Stg 3  . Anticoagulant long-term use   . Dementia   . Sleep apnea   . Myocardial infarction   . Coronary artery disease   . Hypertension     Granddaughter denies - says low blood pressure  . Sick sinus syndrome     a. s/p Medtronic Bi-V PPM 03/25/12  . Syncope     a. Secondary to SSS.  . Orthostatic hypotension   . Atrial flutter   . Atrial fibrillation     Past Surgical History  Procedure Laterality Date  . Tonsillectomy    . Cardioversion  09/17/2011    Procedure: CARDIOVERSION;  Surgeon: Othella Boyer, MD;  Location: [facility] END[facility];  Service: Cardiovascular;  Laterality: N/A;  anesth start time 1005  . Pacemaker insertion  03/25/2012    Medtronic Adapta L dual-chamber pacemaker, serial number [id number]     Family History  Problem Relation Age of Onset  . Other      Patient does not remember   Social History:  reports that he has never smoked. He does not have any smokeless tobacco history on file. He reports that he drinks about 0.6 ounces of alcohol per week. He reports that he does not use illicit drugs.  Allergies: No Known Allergies  Medications Prior to Admission  Medication Sig Dispense Refill  . acetaminophen (TYLENOL) 500 MG tablet Take 500 mg by mouth every 6 (six) hours as needed for pain or fever.       . calcium carbonate (OS-CAL) 600 MG TABS Take 600 mg by mouth daily.      . carvedilol (COREG) 6.25 MG tablet Take 6.25 mg by mouth 2 (two) times daily with a meal.      . furosemide (LASIX) 40 MG  tablet Take 80 mg by mouth every morning.       . hydrOXYzine (ATARAX/VISTARIL) 10 MG tablet Take 10 mg by mouth every 6 (six) hours as needed for itching.       Marland Kitchen lisinopril (PRINIVIL,ZESTRIL) 10 MG tablet Take 5 mg by mouth every morning.       . Multiple Vitamin (MULTIVITAMIN WITH MINERALS) TABS Take 1 tablet by mouth daily.      . potassium chloride SA (K-DUR,KLOR-CON) 20 MEQ tablet Take 1 tablet (20 mEq total) by mouth daily.      Marland Kitchen triamcinolone cream (KENALOG) 0.5 % Apply 1 application topically 2 (two) times daily as needed. For rash      . warfarin (COUMADIN) 5 MG tablet Take 5-7.5 mg by mouth every evening. One and a half pills every day except on Monday and Friday when pt takes only 1 tab        Results for orders placed in visit on 06/16/12 (from the past 48 hour(s))  BASIC METABOLIC PANEL     Status: Abnormal   Collection Time    06/16/12 10:57 AM      Result Value Range   Sodium 133 (*)  135 - 145 mEq/L   Potassium 4.0  3.5 - 5.1 mEq/L   Chloride 99  96 - 112 mEq/L   CO2 27  19 - 32 mEq/L   Glucose, Bld 68 (*) 70 - 99 mg/dL   BUN 34 (*) 6 - 23 mg/dL   Creatinine, Ser 2.0 (*) 0.4 - 1.5 mg/dL   Calcium 9.1  8.4 - 11.9 mg/dL   GFR 14.78 (*) >29.56 mL/min  CBC WITH DIFFERENTIAL     Status: Abnormal   Collection Time    06/16/12 10:57 AM      Result Value Range   WBC 9.5  4.5 - 10.5 K/uL   RBC 4.15 (*) 4.22 - 5.81 Mil/uL   Hemoglobin 13.4  13.0 - 17.0 g/dL   HCT 21.3  08.6 - 57.8 %   MCV 95.4  78.0 - 100.0 fl   MCHC 33.9  30.0 - 36.0 g/dL   RDW 46.9  62.9 - 52.8 %   Platelets 181.0  150.0 - 400.0 K/uL   Neutrophils Relative 57.6  43.0 - 77.0 %   Lymphocytes Relative 27.3  12.0 - 46.0 %   Monocytes Relative 7.6  3.0 - 12.0 %   Eosinophils Relative 6.8 (*) 0.0 - 5.0 %   Basophils Relative 0.7  0.0 - 3.0 %   Neutro Abs 5.5  1.4 - 7.7 K/uL   Lymphs Abs 2.6  0.7 - 4.0 K/uL   Monocytes Absolute 0.7  0.1 - 1.0 K/uL   Eosinophils Absolute 0.7  0.0 - 0.7 K/uL   Basophils  Absolute 0.1  0.0 - 0.1 K/uL  PROTIME-INR     Status: Abnormal   Collection Time    06/16/12 10:57 AM      Result Value Range   Prothrombin Time 24.5 (*) 10.2 - 12.4 sec   INR 2.4 (*) 0.8 - 1.0 ratio   No results found.  All systems reviewed. Negative.  Physical exam  Blood pressure 108/71, pulse 64, temperature 96.6 F (35.9 C), temperature source Oral, resp. rate 16, height 5\' 7"  (1.702 m), weight 171 lb (77.565 kg), SpO2 95.00%. Elderly  Appearing man NAD HEENT: Unremarkable Neck:  No JVD, no thyromegally Lymphatics:  No adenopathy Back:  No CVA tenderness Lungs:  Clear with no wheezes. Well healed old PPM incision. HEART:  Regular rate rhythm, no murmurs, no rubs, no clicks Abd:  Flat, positive bowel sounds, no organomegally, no rebound, no guarding Ext:  2 plus pulses, no edema, no cyanosis, no clubbing Skin:  No rashes no nodules Neuro:  CN II through XII intact, motor grossly intact  Assessment/Plan 1. Symptomatic sinus node dysfunction Plan - ppm.  Gregg Taylor,M.D. 06/17/2012, 11:22 AM

## 2012-06-17 NOTE — Care Management Note (Signed)
    Page 1 of 2   06/17/2012     4:24:10 PM   CARE MANAGEMENT NOTE 06/17/2012  Patient:  ZAVIOR, THOMASON   Account Number:  [id number]  Date Initiated:  06/17/2012  Documentation initiated by:    Subjective/Objective Assessment:   79 yo man with a h/o symptomatic sinus node dysfunction s/p PPM for 9 second pauses developed a pocket infection s/p extraction, now admitted for removal of his temporary perm PPM and insertion of a new device.     Action/Plan:   PPM insertion/ Home with home health   Anticipated DC Date:  06/18/2012   Anticipated DC Plan:  HOME W HOME HEALTH SERVICES      DC Planning Services  CM consult      [facility] Choice  HOME HEALTH   Choice offered to / List presented to:  C-4 Adult Children        HH arranged  HH-1 RN  HH-4 NURSE'S AIDE      HH agency  [facility]   Status of service:  Completed, signed off Medicare Important Message given?   (If response is "NO", the following Medicare IM given date fields will be blank) Date Medicare IM given:   Date Additional Medicare IM given:    Discharge Disposition:    Per UR Regulation:  Reviewed for med. necessity/level of care/duration of stay  If discussed at Long Length of Stay Meetings, dates discussed:    Comments:  06/17/12 @ 1545.Marland KitchenMarland KitchenOletta Cohn, RN, BSN, [employer] [phone] Spoke with pt and grand daughters concerning discharge planning.  Offered [employer] [facility] [employer].  Family chose [facility] to render services.  Kizzie Furnish of [facility] notified.  Family also inquired about respite care for the caregivers- offered list of private duty agencies to choose from.  They will look aver list and decide at a later time.

## 2012-06-17 NOTE — Plan of Care (Signed)
Problem: Consults Goal: Permanent Pacemaker Patient Education (See Patient Education module for education specifics.) Outcome: Completed/Met Date Met:  06/17/12 "You Have a Pacemaker" book reviewed w/ pt.  No questions voiced, refused pacemaker video.

## 2012-06-17 NOTE — Op Note (Signed)
DDD PPM insertion via the right subclavian vein without immediate complication. D# [id number].

## 2012-06-17 NOTE — Op Note (Deleted)
DDD PPM insertion via the right subclavian vein without immediate complication. D# [id number]. 

## 2012-06-18 ENCOUNTER — Ambulatory Visit (HOSPITAL_COMMUNITY): Payer: Medicare Other

## 2012-06-18 DIAGNOSIS — I5022 Chronic systolic (congestive) heart failure: Secondary | ICD-10-CM

## 2012-06-18 DIAGNOSIS — I4891 Unspecified atrial fibrillation: Secondary | ICD-10-CM

## 2012-06-18 LAB — BASIC METABOLIC PANEL
Chloride: 102 mEq/L (ref 96–112)
Creatinine, Ser: 1.66 mg/dL — ABNORMAL HIGH (ref 0.50–1.35)
GFR calc Af Amer: 44 mL/min — ABNORMAL LOW (ref >90)
GFR calc non Af Amer: 38 mL/min — ABNORMAL LOW (ref >90)
Potassium: 4.1 mEq/L (ref 3.5–5.1)

## 2012-06-18 LAB — PROTIME-INR
INR: 2.01 — ABNORMAL HIGH (ref 0.00–1.49)
Prothrombin Time: 22 seconds — ABNORMAL HIGH (ref 11.6–15.2)

## 2012-06-18 LAB — MAGNESIUM: Magnesium: 2.3 mg/dL (ref 1.5–2.5)

## 2012-06-18 NOTE — Discharge Summary (Signed)
ELECTROPHYSIOLOGY DISCHARGE SUMMARY    Patient ID: Spencer Ramirez,  MRN: [id number], DOB/AGE: [birth year] 79 y.o.  Admit date: 06/17/2012 Discharge date: 06/18/2012  Primary Care Physician: Spencer Mask, MD   Primary Cardiologist / EP: Spencer Aho, MD / Spencer Ridgel, MD  Primary Discharge Diagnosis:  1. Symptomatic bradycardia/SSS s/p dual chamber PPM implant  Secondary Discharge Diagnoses:  1. Recent PPM pocket infection  2. Chronic systolic HF  3. CKD  4. PAF  5. CAD  Procedures This Admission:  1. Dual chamber PPM implantation 06/17/2012 RA lead -  Medtronic model 5076 45 cm active fixation pacing lead serial number [id number] RV lead - Medtronic model 5076 52-cm active fixation pacing lead, serial number [id number]  Device - Medtronic Sensia dual-chamber pacemaker, serial number [id number]   History and Hospital Course:  Spencer Ramirez is a 79 year old male with symptomatic bradycardia/SSS and pauses of 9 seconds with no reversible cause. He underwent pacemaker insertion approximately 3 months ago, which was complicated by the development of a pocket infection. Spencer Ramirez was noted not to bathe and found to be in stool at various times during his convalescence. He has some dementia. He underwent PPM system extraction and insertion of a temporary permanent pacemaker at that time. He returned yesterday for insertion of a new dual-chamber pacing system. He tolerated this procedure well without any immediate complication. He remains hemodynamically stable and afebrile. His chest xray shows stable lead placement without pneumothorax. His device interrogation shows normal PPM function with stable lead parameters/measurements. His implant site is intact without significant bleeding or hematoma. He has been given discharge instructions including wound care and activity restrictions. He will follow-up in 10 days for wound check. There were no changes made to his medications. He has been seen, examined  and deemed stable for discharge today by Dr. Nona Dell.  Discharge Vitals: Blood pressure 94/50, pulse 60, temperature 98 F (36.7 C), temperature source Oral, resp. rate 16, height 5\' 7"  (1.702 m), weight 178 lb 9.2 oz (81 kg), SpO2 96.00%.   Labs: Lab Results  Component Value Date   WBC 9.5 06/16/2012   HGB 13.4 06/16/2012   HCT 39.5 06/16/2012   MCV 95.4 06/16/2012   PLT 181.0 06/16/2012    Recent Labs Lab 06/18/12 0535  NA 136  K 4.1  CL 102  CO2 24  BUN 31*  CREATININE 1.66*  CALCIUM 8.9  GLUCOSE 85   Recent Labs  06/18/12 0535  INR 2.01*    Disposition:  The patient is being discharged in stable condition.  Follow-up: Follow-up Information   Follow up with [facility] On 06/27/2012. (At 10:30 AM for wound check)    Contact information:   [address] [place] [address] [phone]      Follow up with Lewayne Bunting, MD On 09/20/2012. (At 10:30 AM)    Contact information:   [address] [place] [address] [phone]     Discharge Medications:    Medication List    ASK your doctor about these medications       acetaminophen 500 MG tablet  Commonly known as:  TYLENOL  Take 500 mg by mouth every 6 (six) hours as needed for pain or fever.     calcium carbonate 600 MG Tabs  Commonly known as:  OS-CAL  Take 600 mg by mouth daily.     carvedilol 6.25 MG tablet  Commonly known as:  COREG  Take  6.25 mg by mouth 2 (two) times daily with a meal.     furosemide 40 MG tablet  Commonly known as:  LASIX  Take 80 mg by mouth every morning.     hydrOXYzine 10 MG tablet  Commonly known as:  ATARAX/VISTARIL  Take 10 mg by mouth every 6 (six) hours as needed for itching.     lisinopril 10 MG tablet  Commonly known as:  PRINIVIL,ZESTRIL  Take 5 mg by mouth every morning.     multivitamin with minerals Tabs  Take 1 tablet by mouth daily.     potassium chloride SA 20 MEQ tablet  Commonly known as:   K-DUR,KLOR-CON  Take 1 tablet (20 mEq total) by mouth daily.     triamcinolone cream 0.5 %  Commonly known as:  KENALOG  Apply 1 application topically 2 (two) times daily as needed. For rash     warfarin 5 MG tablet  Commonly known as:  COUMADIN  Take 5-7.5 mg by mouth every evening. One and a half pills every day except on Monday and Friday when pt takes only 1 tab       Duration of Discharge Encounter: Greater than 30 minutes including physician time.  Signed, Rick Duff, PA-C 06/18/2012, 8:18 AM

## 2012-06-18 NOTE — Progress Notes (Signed)
Tele showed 5 beats of VT with immediate return to a-paced.  Pt dozing quietly, awakened easily, asymptomatic. BP 97/49. Dr Terressa Koyanagi informed, orders for lab received and entered.

## 2012-06-18 NOTE — Op Note (Signed)
Spencer Ramirez, KISLING NO.:  [id number]  MEDICAL RECORD NO.:  [id number]  LOCATION:  6531                         FACILITY:  [facility]  PHYSICIAN:  Doylene Canning. Ladona Ridgel, MD    DATE OF BIRTH:  November 27, 1930  DATE OF PROCEDURE:  06/17/2012 DATE OF DISCHARGE:                              OPERATIVE REPORT   PROCEDURE PERFORMED:  Insertion of a dual-chamber pacemaker.  INDICATION:  Symptomatic sinus node dysfunction.  INTRODUCTION:  The patient is a 79 year old male with a history of symptomatic bradycardia with pauses of 9 seconds with no reversible cause.  He underwent pacemaker insertion approximately 3 months ago, which was complicated by the development of a pocket infection.  The patient was noted not to bathe and found to be in stool at various times during his convalescence.  He has some dementia.  He underwent extraction of that device and insertion of a temporary permanent pacemaker and has now returned today for insertion of a new dual-chamber pacing system.  PROCEDURE:  After informed consent was obtained, the patient was taken to the diagnostic EP lab in a fasting state.  After usual preparation and draping, intravenous fentanyl and midazolam was given for sedation. 30 mL of lidocaine was infiltrated into the right infraclavicular region.  A 5-cm incision was carried out over this region. Electrocautery was utilized to dissect down to the fascial plane. Initial attempt to puncture the right subclavian vein were unsuccessful. 10 mL of contrast was injected into the right upper extremity venous system demonstrating that the right subclavian vein was in fact patent. The vein was then punctured x2 and the Medtronic model 5076 52-cm active fixation pacing lead, serial number [id number] was advanced to the right ventricle and the Medtronic model 5076 45 cm active fixation pacing lead serial number [id number] was advanced to the right atrium. First mapping was carried  out in the right ventricle.  It should be noted that the R-waves were reduced throughout the right ventricle from the RV outflow tract to the RV apex to the RV inflow tract.  The septum had small R-waves as well.  At the final site, the R-waves initially measured 6-7 mV and the lead was actively fixed.  After fixation of the lead, the R-waves were approximately 4 mV.  The pacing impedance was stable at 800 ohms and threshold was less than a V at 0.5 milliseconds with a large injury current with active fixation of the lead.  10 V pacing not stimulate the diaphragm.  With the ventricular lead in satisfactory position, attention was then turned to placement of the atrial lead, which was placed in anterolateral portion of the right atrium where P-waves measured 1.5 to 2 mV.  The pacing impedance was 560 ohms and the threshold was 1 V at 0.5 milliseconds.  Again, there was a large injury current with active fixation of the lead and 10 V pacing did not stimulate the diaphragm.  With both the atrial and ventricular leads in satisfactory position, they were secured to the subpectoral fascia with a figure-of-eight silk suture.  The sewing sleeve was secured with silk suture.  Electrocautery was utilized to accept a  subcutaneous pocket.  Antibiotic irrigation was utilized to irrigate the pocket.  Electrocautery was utilized to assure hemostasis.  The Medtronic Sensia dual-chamber pacemaker, serial number [id number] was connected to the atrial and RV leads and placed back in the subcutaneous pocket.  The pocket was irrigated with antibiotic irrigation and the incision was closed with 2-0 and 3-0 Vicryl.  Benzoin and Steri-Strips were painted on the skin.  A pressure dressing applied.  The patient was returned to his room in satisfactory condition.  COMPLICATIONS:  There were no immediate procedure complications.  RESULTS:  This demonstrates successful implantation of a Medtronic dual- chamber  pacemaker in a patient with symptomatic bradycardia and sinus node dysfunction.     Doylene Canning. Ladona Ridgel, MD     GWT/MEDQ  D:  06/17/2012  T:  06/18/2012  Job:  [id number]  cc:   Georga Hacking, M.D.

## 2012-06-18 NOTE — Progress Notes (Signed)
    Primary cardiologist: Dr. Lewayne Bunting  Patient: Spencer Ramirez Date of Encounter: 06/18/2012, 7:36 AM Admit date: 06/17/2012     Subjective  Mr. Feeny has no complaints this AM. He denies CP, SOB or palpitations.   Objective  Physical Exam: Vitals: BP 104/69  Pulse 65  Temp(Src) 97.5 F (36.4 C) (Oral)  Resp 16  Ht 5\' 7"  (1.702 m)  Wt 178 lb 9.2 oz (81 kg)  BMI 27.96 kg/m2  SpO2 96% General: Well developed, well appearing 79 year old male in no acute distress. Neck: Supple. JVD not elevated. Lungs: Clear bilaterally to auscultation without wheezes, rales, or rhonchi. Breathing is unlabored. Heart: RRR S1 S2 without murmurs, rubs, or gallops.  Abdomen: Soft, non-distended. Extremities: No clubbing or cyanosis. No edema.  Distal pedal pulses are 2+ and equal bilaterally. Neuro: Alert and oriented X 3. Moves all extremities spontaneously. No focal deficits.  Intake/Output:  Intake/Output Summary (Last 24 hours) at 06/18/12 0736 Last data filed at 06/18/12 0336  Gross per 24 hour  Intake    695 ml  Output   1400 ml  Net   -705 ml    Inpatient Medications:  . calcium carbonate  1 tablet Oral Q breakfast  . carvedilol  6.25 mg Oral BID WC  . furosemide  80 mg Oral q morning - 10a  . lisinopril  5 mg Oral q morning - 10a  . multivitamin with minerals  1 tablet Oral Daily  . potassium chloride SA  20 mEq Oral Daily  . warfarin  7.5 mg Oral Custom  . Warfarin - Physician Dosing Inpatient   Does not apply q1800    Labs:  Recent Labs  06/16/12 1057 06/18/12 0535  NA 133* 136  K 4.0 4.1  CL 99 102  CO2 27 24  GLUCOSE 68* 85  BUN 34* 31*  CREATININE 2.0* 1.66*  CALCIUM 9.1 8.9  MG  --  2.3    Recent Labs  06/16/12 1057  WBC 9.5  NEUTROABS 5.5  HGB 13.4  HCT 39.5  MCV 95.4  PLT 181.0    Recent Labs  06/18/12 0535  INR 2.01*    Radiology/Studies: CXR this AM shows stable lead placement without PTX  Telemetry: A paced V sensed Device  interrogation: Performed by industry this AM with niormal findings    Assessment and Plan  1. Symptomatic bradycardia/SSS s/p dual chamber PPM implantation yesterday 2. Recent PPM pocket infection 3. Chronic systolic HF 4. CKD 5. PAF 6. CAD  Mr. Blanke is doing well post PPM implant. CXR stable. Wound intact without bleeding or hematoma. Awaiting device interrogation and if normal, plan for DC home.   Signed, Rick Duff PA-C  Attending note:  Patient seen and examined. He reports no chest pain, palpitations, or dizziness this morning. He is stable status post pacemaker placement, device interrogation performed this morning, and chest x-ray with no pneumothorax. He will be discharged home on current medications including Coumadin which is followed by his primary care provider. Standard wound followup with our EP team in one week and device followup with Dr. Ladona Ridgel thereafter.  Jonelle Sidle, M.D., F.A.C.C.

## 2012-06-19 NOTE — Discharge Summary (Signed)
Please see also my rounding note. 

## 2012-06-20 ENCOUNTER — Encounter: Payer: Self-pay | Admitting: Internal Medicine

## 2012-06-20 DIAGNOSIS — I4891 Unspecified atrial fibrillation: Secondary | ICD-10-CM

## 2012-06-20 DIAGNOSIS — I495 Sick sinus syndrome: Secondary | ICD-10-CM | POA: Insufficient documentation

## 2012-06-20 NOTE — Assessment & Plan Note (Signed)
The patient has had no recent symptoms. He has had documented 9 second pauses with no reversible cause. He will undergo PPM re-insertion.

## 2012-06-20 NOTE — Progress Notes (Signed)
HPI Mr. Spencer Ramirez returns today for followup. He is a pleasant 79 yo man with a h/o symptomatic bradycardia with 9 second pauses, s/p PPM insertion who developed a pocket infection and had his device removed and packed with a temporary permanent pm inserted. He returns for followup prior to a new device insertion on the contralateral side. No Known Allergies   Current Outpatient Prescriptions  Medication Sig Dispense Refill  . acetaminophen (TYLENOL) 500 MG tablet Take 500 mg by mouth every 6 (six) hours as needed for pain or fever.       . calcium carbonate (OS-CAL) 600 MG TABS Take 600 mg by mouth daily.      . carvedilol (COREG) 6.25 MG tablet Take 6.25 mg by mouth 2 (two) times daily with a meal.      . furosemide (LASIX) 40 MG tablet Take 80 mg by mouth every morning.       . hydrOXYzine (ATARAX/VISTARIL) 10 MG tablet Take 10 mg by mouth every 6 (six) hours as needed for itching.       Marland Kitchen lisinopril (PRINIVIL,ZESTRIL) 10 MG tablet Take 5 mg by mouth every morning.       . Multiple Vitamin (MULTIVITAMIN WITH MINERALS) TABS Take 1 tablet by mouth daily.      . potassium chloride SA (K-DUR,KLOR-CON) 20 MEQ tablet Take 1 tablet (20 mEq total) by mouth daily.      Marland Kitchen triamcinolone cream (KENALOG) 0.5 % Apply 1 application topically 2 (two) times daily as needed. For rash      . warfarin (COUMADIN) 5 MG tablet Take 5-7.5 mg by mouth every evening. One and a half pills every day except on Monday and Friday when pt takes only 1 tab       No current facility-administered medications for this visit.     Past Medical History  Diagnosis Date  . Chronic systolic CHF (congestive heart failure)   . Cardiomyopathy   . Chronic venous insufficiency   . Chronic kidney disease     Stg 3  . Anticoagulant long-term use   . Dementia   . Sleep apnea   . Myocardial infarction   . Coronary artery disease   . Hypertension     Granddaughter denies - says low blood pressure  . Sick sinus syndrome     a. s/p  Medtronic Bi-V PPM 03/25/12  . Syncope     a. Secondary to SSS.  . Orthostatic hypotension   . Atrial flutter   . Atrial fibrillation   . Pacemaker     ROS:   All systems reviewed and negative except as noted in the HPI.   Past Surgical History  Procedure Laterality Date  . Tonsillectomy    . Cardioversion  09/17/2011    Procedure: CARDIOVERSION;  Surgeon: Othella Boyer, MD;  Location: [facility] ENDOSCOPY;  Service: Cardiovascular;  Laterality: N/A;  anesth start time 1005  . Pacemaker insertion  03/25/2012    Medtronic Adapta L dual-chamber pacemaker, serial number [id number]   . Pacemaker revision  06/17/2012     Family History  Problem Relation Age of Onset  . Other      Patient does not remember     History   Social History  . Marital Status: Widowed    Spouse Name: N/A    Number of Children: N/A  . Years of Education: N/A   Occupational History  . Not on file.   Social History Main Topics  . Smoking status: Never  Smoker   . Smokeless tobacco: Never Used  . Alcohol Use: 0.6 oz/week    1 Glasses of wine per week     Comment: once a week  . Drug Use: No  . Sexually Active: Not on file   Other Topics Concern  . Not on file   Social History Narrative  . No narrative on file     BP 102/64  Pulse 64  Ht 5\' 6"  (1.676 m)  Wt 174 lb 6.4 oz (79.107 kg)  BMI 28.16 kg/m2  Physical Exam:  diskempt appearing NAD HEENT: Unremarkable Neck:  7 cm JVD, no thyromegally Lungs:  Clear, with healing left sided pocket HEART:  Regular rate rhythm, no murmurs, no rubs, no clicks Abd:  soft, positive bowel sounds, no organomegally, no rebound, no guarding Ext:  2 plus pulses, no edema, no cyanosis, no clubbing Skin:  No rashes no nodules Neuro:  CN II through XII intact, motor grossly intact  EKG - nsr  Assess/Plan:

## 2012-06-20 NOTE — Assessment & Plan Note (Signed)
He is healing appropriately. Will plan ppm reinsertion on contra-lateral side.

## 2012-06-27 ENCOUNTER — Other Ambulatory Visit: Payer: Self-pay | Admitting: Internal Medicine

## 2012-06-27 ENCOUNTER — Ambulatory Visit (INDEPENDENT_AMBULATORY_CARE_PROVIDER_SITE_OTHER): Payer: Medicare Other | Admitting: *Deleted

## 2012-06-27 DIAGNOSIS — I495 Sick sinus syndrome: Secondary | ICD-10-CM

## 2012-06-27 LAB — PACEMAKER DEVICE OBSERVATION
AL IMPEDENCE PM: 537 Ohm
AL THRESHOLD: 0.5 V
BATTERY VOLTAGE: 2.8 V
RV LEAD IMPEDENCE PM: 674 Ohm

## 2012-06-27 NOTE — Progress Notes (Signed)
Wound check ppm in clinic. Normal device function. Battery longevity 10 years. ROV 09-20-12 @ 1030 with GT.

## 2012-06-28 ENCOUNTER — Encounter: Payer: Medicare Other | Admitting: Internal Medicine

## 2012-07-18 ENCOUNTER — Encounter (HOSPITAL_COMMUNITY): Payer: Self-pay

## 2012-07-18 ENCOUNTER — Encounter: Payer: Self-pay | Admitting: Internal Medicine

## 2012-07-18 ENCOUNTER — Emergency Department (HOSPITAL_COMMUNITY): Payer: Medicare Other

## 2012-07-18 ENCOUNTER — Inpatient Hospital Stay (HOSPITAL_COMMUNITY)
Admission: EM | Admit: 2012-07-18 | Discharge: 2012-07-21 | DRG: 857 | Disposition: A | Discharge status: Home / Self Care | Payer: Medicare Other | Attending: Cardiology | Admitting: Cardiology

## 2012-07-18 DIAGNOSIS — I498 Other specified cardiac arrhythmias: Secondary | ICD-10-CM | POA: Diagnosis present

## 2012-07-18 DIAGNOSIS — Z79899 Other long term (current) drug therapy: Secondary | ICD-10-CM

## 2012-07-18 DIAGNOSIS — I509 Heart failure, unspecified: Secondary | ICD-10-CM | POA: Diagnosis present

## 2012-07-18 DIAGNOSIS — I429 Cardiomyopathy, unspecified: Secondary | ICD-10-CM | POA: Diagnosis present

## 2012-07-18 DIAGNOSIS — T8140XA Infection following a procedure, unspecified, initial encounter: Principal | ICD-10-CM | POA: Diagnosis present

## 2012-07-18 DIAGNOSIS — G4730 Sleep apnea, unspecified: Secondary | ICD-10-CM | POA: Diagnosis present

## 2012-07-18 DIAGNOSIS — T827XXA Infection and inflammatory reaction due to other cardiac and vascular devices, implants and grafts, initial encounter: Secondary | ICD-10-CM

## 2012-07-18 DIAGNOSIS — I252 Old myocardial infarction: Secondary | ICD-10-CM

## 2012-07-18 DIAGNOSIS — F0390 Unspecified dementia without behavioral disturbance: Secondary | ICD-10-CM | POA: Diagnosis present

## 2012-07-18 DIAGNOSIS — Z950 Presence of cardiac pacemaker: Secondary | ICD-10-CM

## 2012-07-18 DIAGNOSIS — I2589 Other forms of chronic ischemic heart disease: Secondary | ICD-10-CM | POA: Diagnosis present

## 2012-07-18 DIAGNOSIS — I4892 Unspecified atrial flutter: Secondary | ICD-10-CM | POA: Diagnosis present

## 2012-07-18 DIAGNOSIS — N183 Chronic kidney disease, stage 3 unspecified: Secondary | ICD-10-CM | POA: Diagnosis present

## 2012-07-18 DIAGNOSIS — I5022 Chronic systolic (congestive) heart failure: Secondary | ICD-10-CM | POA: Diagnosis present

## 2012-07-18 DIAGNOSIS — I2510 Atherosclerotic heart disease of native coronary artery without angina pectoris: Secondary | ICD-10-CM | POA: Diagnosis present

## 2012-07-18 DIAGNOSIS — I4891 Unspecified atrial fibrillation: Secondary | ICD-10-CM | POA: Diagnosis present

## 2012-07-18 DIAGNOSIS — I872 Venous insufficiency (chronic) (peripheral): Secondary | ICD-10-CM | POA: Diagnosis present

## 2012-07-18 DIAGNOSIS — L02219 Cutaneous abscess of trunk, unspecified: Secondary | ICD-10-CM | POA: Diagnosis present

## 2012-07-18 DIAGNOSIS — Y838 Other surgical procedures as the cause of abnormal reaction of the patient, or of later complication, without mention of misadventure at the time of the procedure: Secondary | ICD-10-CM | POA: Diagnosis present

## 2012-07-18 DIAGNOSIS — Z7901 Long term (current) use of anticoagulants: Secondary | ICD-10-CM

## 2012-07-18 NOTE — ED Provider Notes (Signed)
History     CSN: [id number]  Arrival date & time 07/18/12  2027   First MD Initiated Contact with Patient 07/18/12 2259      Chief Complaint  Patient presents with  . Wound Check    (Consider location/radiation/quality/duration/timing/severity/associated sxs/prior treatment) HPI 79-year-old male presents emergency department with his family with complaint of pacemaker site.  Drainage.  Pacemaker was replaced 5-14.  He had had it replaced from the site on the left 2 to pocket infection.  Patient was seen in the clinic last week and told that the incision site.  "Looked angry.".  Drainage started today.  Site has been draining yellow, green, bloody discharge.  No pain to the site.  No fevers no chills.  No body aches.   Past Medical History  Diagnosis Date  . Chronic systolic CHF (congestive heart failure)   . Cardiomyopathy   . Chronic venous insufficiency   . Chronic kidney disease     Stg 3  . Anticoagulant long-term use   . Dementia   . Sleep apnea   . Myocardial infarction   . Coronary artery disease   . Hypertension     Granddaughter denies - says low blood pressure  . Sick sinus syndrome     a. s/p Medtronic Bi-V PPM 03/25/12  . Syncope     a. Secondary to SSS.  . Orthostatic hypotension   . Atrial flutter   . Atrial fibrillation   . Pacemaker     Past Surgical History  Procedure Laterality Date  . Tonsillectomy    . Cardioversion  09/17/2011    Procedure: CARDIOVERSION;  Surgeon: Othella Boyer, MD;  Location: [facility] ENDOSCOPY;  Service: Cardiovascular;  Laterality: N/A;  anesth start time 1005  . Pacemaker insertion  03/25/2012    Medtronic Adapta L dual-chamber pacemaker, serial number [id number]   . Pacemaker revision  06/17/2012    Family History  Problem Relation Age of Onset  . Other      Patient does not remember    History  Substance Use Topics  . Smoking status: Never Smoker   . Smokeless tobacco: Never Used  . Alcohol Use: 0.6 oz/week    1 Glasses  of wine per week     Comment: once a week      Review of Systems  All other systems reviewed and are negative.  other than listed in history of present illness  Allergies  Review of patient's allergies indicates no known allergies.  Home Medications   Current Outpatient Rx  Name  Route  Sig  Dispense  Refill  . acetaminophen (TYLENOL) 500 MG tablet   Oral   Take 500 mg by mouth every 6 (six) hours as needed for pain or fever.          . calcium carbonate (OS-CAL) 600 MG TABS   Oral   Take 600 mg by mouth daily.         . carvedilol (COREG) 6.25 MG tablet   Oral   Take 6.25 mg by mouth 2 (two) times daily with a meal.         . furosemide (LASIX) 40 MG tablet   Oral   Take 80 mg by mouth every morning.          . hydrOXYzine (ATARAX/VISTARIL) 10 MG tablet   Oral   Take 10 mg by mouth every 6 (six) hours as needed for itching.          Marland Kitchen  lisinopril (PRINIVIL,ZESTRIL) 10 MG tablet   Oral   Take 5 mg by mouth every morning.          . Multiple Vitamin (MULTIVITAMIN WITH MINERALS) TABS   Oral   Take 1 tablet by mouth daily.         . potassium chloride SA (K-DUR,KLOR-CON) 20 MEQ tablet   Oral   Take 1 tablet (20 mEq total) by mouth daily.         . Rivaroxaban (XARELTO) 15 MG TABS tablet   Oral   Take 15 mg by mouth daily with supper.         . triamcinolone cream (KENALOG) 0.5 %   Topical   Apply 1 application topically 2 (two) times daily as needed. For rash           BP 107/70  Pulse 89  Temp(Src) 97.4 F (36.3 C) (Oral)  Resp 17  SpO2 98%  Physical Exam  Nursing note and vitals reviewed. Constitutional: He is oriented to person, place, and time. He appears well-developed and well-nourished.  HENT:  Head: Normocephalic and atraumatic.  Nose: Nose normal.  Mouth/Throat: Oropharynx is clear and moist.  Eyes: Conjunctivae and EOM are normal. Pupils are equal, round, and reactive to light.  Neck: Normal range of motion. Neck  supple. No JVD present. No tracheal deviation present. No thyromegaly present.  Cardiovascular: Normal rate, regular rhythm, normal heart sounds and intact distal pulses.  Exam reveals no gallop and no friction rub.   No murmur heard. Pulmonary/Chest: Effort normal and breath sounds normal. No stridor. No respiratory distress. He has no wheezes. He has no rales. He exhibits no tenderness.  Pacemaker incision site is open on both ends.  There is purulent drainage able to be expressed from the most inferior lateral portion.  There is no crepitus, no warmth, no erythema.  Abdominal: Soft. Bowel sounds are normal. He exhibits no distension and no mass. There is no tenderness. There is no rebound and no guarding.  Musculoskeletal: Normal range of motion. He exhibits no edema and no tenderness.  Lymphadenopathy:    He has no cervical adenopathy.  Neurological: He is alert and oriented to person, place, and time. He exhibits normal muscle tone. Coordination normal.  Skin: Skin is dry. No rash noted. No erythema. No pallor.  Psychiatric: He has a normal mood and affect. His behavior is normal. Judgment and thought content normal.        ED Course  Procedures (including critical care time)  Labs Reviewed  CULTURE, BLOOD (ROUTINE X 2)  CULTURE, BLOOD (ROUTINE X 2)  WOUND CULTURE  PROTIME-INR  CBC WITH DIFFERENTIAL  BASIC METABOLIC PANEL   Dg Chest 2 View  07/18/2012   *RADIOLOGY REPORT*  Clinical Data: Chest pain.  CHEST - 2 VIEW  Comparison: Chest x-ray 06/18/2012.  Findings: Lung volumes are normal.  No consolidative airspace disease.  No pleural effusions.  No pneumothorax.  No pulmonary nodule or mass noted.  Pulmonary vasculature and the cardiomediastinal silhouette are within normal limits.  Right-sided pacemaker device in place with lead tips projecting over the expected location of the right atrium and right ventricular apex. Atherosclerotic calcifications within the thoracic aorta.   Lateral projection demonstrates the lower thoracic vertebral body compression fracture (likely T11), which appears similar to prior study.  IMPRESSION: 1. No radiographic evidence of acute cardiopulmonary disease. 2.  Old vertebral body compression fracture of the lower thoracic spine (likely T11). 3.  Atherosclerosis.  Original Report Authenticated By: Trudie Reed, M.D.     1. Infection of pacemaker pocket, initial encounter       MDM  79 year old male with infection at the incision site of the pacemaker.  We'll get baseline labs.  Discharge sent for culture.  Will consult [place] cardiology for their evaluation.        Olivia Mackie, MD 07/19/12 [phone]

## 2012-07-18 NOTE — ED Notes (Signed)
Pt presents to ED c/o of pacemaker incision opening, slightly draining and a small amount of blood. Pacemaker was inserted about a month ago. Denies fever.

## 2012-07-18 NOTE — ED Notes (Signed)
Pt had a pacemaker implanted in right chest wall a month ago. Reports yellow-greenish pus coming from the incision site. Denies fever, nausea, vomiting with the infection. Denies pain at this time.

## 2012-07-19 ENCOUNTER — Encounter (HOSPITAL_COMMUNITY): Payer: Self-pay | Admitting: Cardiology

## 2012-07-19 DIAGNOSIS — T827XXA Infection and inflammatory reaction due to other cardiac and vascular devices, implants and grafts, initial encounter: Secondary | ICD-10-CM

## 2012-07-19 LAB — BASIC METABOLIC PANEL
BUN: 34 mg/dL — ABNORMAL HIGH (ref 6–23)
Chloride: 98 mEq/L (ref 96–112)
Chloride: 103 mEq/L (ref 96–112)
GFR calc Af Amer: 41 mL/min — ABNORMAL LOW (ref >90)
GFR calc non Af Amer: 36 mL/min — ABNORMAL LOW (ref >90)
Glucose, Bld: 86 mg/dL (ref 70–99)
Potassium: 3.8 mEq/L (ref 3.5–5.1)
Potassium: 3.7 mEq/L (ref 3.5–5.1)

## 2012-07-19 LAB — CBC WITH DIFFERENTIAL/PLATELET
HCT: 35.1 % — ABNORMAL LOW (ref 39.0–52.0)
Hemoglobin: 12.1 g/dL — ABNORMAL LOW (ref 13.0–17.0)
Lymphs Abs: 2.5 10*3/uL (ref 0.7–4.0)
MCH: 32.3 pg (ref 26.0–34.0)
Monocytes Relative: 8 % (ref 3–12)
Neutro Abs: 5.2 10*3/uL (ref 1.7–7.7)
Neutrophils Relative %: 59 % (ref 43–77)
RBC: 3.75 MIL/uL — ABNORMAL LOW (ref 4.22–5.81)

## 2012-07-19 LAB — PROTIME-INR
INR: 2.33 — ABNORMAL HIGH (ref 0.00–1.49)
Prothrombin Time: 24.5 seconds — ABNORMAL HIGH (ref 11.6–15.2)

## 2012-07-19 MED ORDER — CARVEDILOL 6.25 MG PO TABS
6.2500 mg | ORAL_TABLET | Freq: Two times a day (BID) | ORAL | Status: DC
  Administered 2012-07-19 – 2012-07-21 (×5): 6.25 mg via ORAL
  Filled 2012-07-19 (×7): qty 1

## 2012-07-19 MED ORDER — VANCOMYCIN HCL 10 G IV SOLR
1500.0000 mg | Freq: Once | INTRAVENOUS | Status: AC
  Administered 2012-07-19: 1500 mg via INTRAVENOUS
  Filled 2012-07-19: qty 1500

## 2012-07-19 MED ORDER — RIVAROXABAN 15 MG PO TABS
15.0000 mg | ORAL_TABLET | Freq: Every day | ORAL | Status: DC
  Administered 2012-07-19 – 2012-07-20 (×2): 15 mg via ORAL
  Filled 2012-07-19 (×3): qty 1

## 2012-07-19 MED ORDER — LISINOPRIL 5 MG PO TABS
5.0000 mg | ORAL_TABLET | Freq: Every morning | ORAL | Status: DC
  Administered 2012-07-19 – 2012-07-21 (×3): 5 mg via ORAL
  Filled 2012-07-19 (×3): qty 1

## 2012-07-19 MED ORDER — VANCOMYCIN HCL 10 G IV SOLR: 1250.0000 mg | INTRAVENOUS | Status: DC

## 2012-07-19 MED ORDER — SODIUM CHLORIDE 0.9 % IV SOLN
INTRAVENOUS | Status: DC
  Administered 2012-07-20: 05:00:00 via INTRAVENOUS

## 2012-07-19 MED ORDER — CHLORHEXIDINE GLUCONATE 4 % EX LIQD
60.0000 mL | Freq: Once | CUTANEOUS | Status: AC
  Administered 2012-07-19: 4 via TOPICAL
  Filled 2012-07-19 (×2): qty 60

## 2012-07-19 MED ORDER — CALCIUM CARBONATE 1250 (500 CA) MG PO TABS
1250.0000 mg | ORAL_TABLET | Freq: Every day | ORAL | Status: DC
  Administered 2012-07-19 – 2012-07-21 (×3): 1250 mg via ORAL
  Filled 2012-07-19 (×4): qty 1

## 2012-07-19 MED ORDER — ADULT MULTIVITAMIN W/MINERALS CH
1.0000 | ORAL_TABLET | Freq: Every day | ORAL | Status: DC
  Administered 2012-07-19 – 2012-07-21 (×3): 1 via ORAL
  Filled 2012-07-19 (×3): qty 1

## 2012-07-19 MED ORDER — CHLORHEXIDINE GLUCONATE 4 % EX LIQD
60.0000 mL | Freq: Once | CUTANEOUS | Status: AC
  Administered 2012-07-20: 4 via TOPICAL
  Filled 2012-07-19: qty 60

## 2012-07-19 MED ORDER — HYDROXYZINE HCL 10 MG PO TABS
10.0000 mg | ORAL_TABLET | Freq: Four times a day (QID) | ORAL | Status: DC | PRN
  Administered 2012-07-20 – 2012-07-21 (×2): 10 mg via ORAL
  Filled 2012-07-19 (×3): qty 1

## 2012-07-19 MED ORDER — FUROSEMIDE 80 MG PO TABS
80.0000 mg | ORAL_TABLET | Freq: Every morning | ORAL | Status: DC
  Administered 2012-07-19 – 2012-07-21 (×3): 80 mg via ORAL
  Filled 2012-07-19 (×3): qty 1

## 2012-07-19 MED ORDER — POTASSIUM CHLORIDE CRYS ER 20 MEQ PO TBCR
20.0000 meq | EXTENDED_RELEASE_TABLET | Freq: Every day | ORAL | Status: DC
  Administered 2012-07-19 – 2012-07-21 (×3): 20 meq via ORAL
  Filled 2012-07-19 (×3): qty 1

## 2012-07-19 MED ORDER — SODIUM CHLORIDE 0.9 % IR SOLN
80.0000 mg | Status: DC
  Filled 2012-07-19 (×2): qty 2

## 2012-07-19 MED ORDER — CEFAZOLIN SODIUM-DEXTROSE 2-3 GM-% IV SOLR
2.0000 g | INTRAVENOUS | Status: DC
  Filled 2012-07-19: qty 50

## 2012-07-19 NOTE — ED Notes (Signed)
Dr. Hochrein at the bedside. 

## 2012-07-19 NOTE — Progress Notes (Signed)
ANTIBIOTIC CONSULT NOTE - INITIAL  Pharmacy Consult for Vancomycin Indication: pacemaker infection  No Known Allergies  Patient Measurements: Height: 5' 6.93" (170 cm) Weight: 178 lb 9.2 oz (81 kg) IBW/kg (Calculated) : 65.94  Vital Signs: Temp: 97.4 F (36.3 C) (06/02 2038) Temp src: Oral (06/02 2038) BP: 105/69 mmHg (06/03 0200) Pulse Rate: 62 (06/03 0200)  Labs:  Recent Labs  07/18/12 2304  WBC 8.8  HGB 12.1*  PLT 130*  CREATININE 1.93*   Estimated Creatinine Clearance: 31.6 ml/min (by C-G formula based on Cr of 1.93). No results found for this basename: VANCOTROUGH, VANCOPEAK, VANCORANDOM, GENTTROUGH, GENTPEAK, GENTRANDOM, TOBRATROUGH, TOBRAPEAK, TOBRARND, AMIKACINPEAK, AMIKACINTROU, AMIKACIN,  in the last 72 hours   Microbiology: No results found for this or any previous visit (from the past 720 hour(s)).  Medical History: Past Medical History  Diagnosis Date  . Chronic systolic CHF (congestive heart failure)   . Chronic venous insufficiency   . Chronic kidney disease     Stg 3  . Anticoagulant long-term use   . Dementia   . Sleep apnea   . Myocardial infarction   . Coronary artery disease   . Sick sinus syndrome   . Syncope     Secondary to SSS.  . Orthostatic hypotension   . Atrial flutter   . Atrial fibrillation     Medications:  APAP  Oscal  Coreg  Lasix  Atarax  Zestril  MVI  KCl  Xarelto   Assessment: 79 yo male with infected pacemaker site for empiric antibiotics  Goal of Therapy:  Vancomycin trough level 15-20 mcg/ml  Plan:  Vancomycin 1500 mg IV now, then 1250 mg IV q48h  Daemien Fronczak, Gary Fleet 07/19/2012,2:12 AM

## 2012-07-19 NOTE — H&P (Signed)
CARDIOLOGY ADMISSION NOTE  Patient ID: Spencer Ramirez MRN: [id number] DOB/AGE: 06/08/1930 79 y.o.  Admit date: 07/18/2012 Primary Physician   Kaleen Mask, MD Primary Cardiologist   Dr. Tilley/Dr. Ladona Ridgel Chief Complaint    Pacemaker infection  HPI:  The patient has a history of SSS with pacemaker placement 03/24/12 for sinus pauses up to 9 seconds with indication for beta blocker given his ischemic cardiomyopathy.  This was complicated by a pacemaker infection.  He had extraction of this with insertion of a temporary permanent pacemaker with a course of appropriate antibiotics.  He subsequently returned for replacement of the pacemaker on the right side on 06/17/12.  He now returns for follow up after his family noticed drainage from the device.  He was seen recently in the clinic by Dr. Donnie Aho and there was some slight erythema noted on the medial edge of the scar.  However, there was no obvious infection, drainage or systemic symptoms of infection.  Tonight his granddaughter noticed that the wound was draining.  He was seen in the ER and the Dr. Norlene Campbell expressed 2 - 3 cc of pus.  He denies any fever or chills.  He denies any pain.  The patient denies any new symptoms such as chest discomfort, neck or arm discomfort. There has been no new shortness of breath, PND or orthopnea. There have been no reported palpitations, presyncope or syncope.  History is limited by dementia.  Of note his granddaughter suspect that he does scratch the wound as he has scratches different areas of his chest and arms.    Past Medical History  Diagnosis Date  . Chronic systolic CHF (congestive heart failure)   . Cardiomyopathy   . Chronic venous insufficiency   . Chronic kidney disease     Stg 3  . Anticoagulant long-term use   . Dementia   . Sleep apnea   . Myocardial infarction   . Coronary artery disease   . Hypertension     Granddaughter denies - says low blood pressure  . Sick sinus syndrome     a.  s/p Medtronic Bi-V PPM 03/25/12  . Syncope     a. Secondary to SSS.  . Orthostatic hypotension   . Atrial flutter   . Atrial fibrillation   . Pacemaker     Past Surgical History  Procedure Laterality Date  . Tonsillectomy    . Cardioversion  09/17/2011    Procedure: CARDIOVERSION;  Surgeon: Othella Boyer, MD;  Location: [facility] ENDOSCOPY;  Service: Cardiovascular;  Laterality: N/A;  anesth start time 1005  . Pacemaker insertion  03/25/2012    Medtronic Adapta L dual-chamber pacemaker, serial number [id number]   . Pacemaker revision  06/17/2012    No Known Allergies Prior to Admission medications   Medication Sig Start Date End Date Taking? Authorizing Provider  acetaminophen (TYLENOL) 500 MG tablet Take 500 mg by mouth every 6 (six) hours as needed for pain or fever.    Yes Historical Provider, MD  calcium carbonate (OS-CAL) 600 MG TABS Take 600 mg by mouth daily.   Yes Historical Provider, MD  carvedilol (COREG) 6.25 MG tablet Take 6.25 mg by mouth 2 (two) times daily with a meal.   Yes Historical Provider, MD  furosemide (LASIX) 40 MG tablet Take 80 mg by mouth every morning.    Yes Historical Provider, MD  hydrOXYzine (ATARAX/VISTARIL) 10 MG tablet Take 10 mg by mouth every 6 (six) hours as needed for itching.  Yes Historical Provider, MD  lisinopril (PRINIVIL,ZESTRIL) 10 MG tablet Take 5 mg by mouth every morning.    Yes Historical Provider, MD  Multiple Vitamin (MULTIVITAMIN WITH MINERALS) TABS Take 1 tablet by mouth daily.   Yes Historical Provider, MD  potassium chloride SA (K-DUR,KLOR-CON) 20 MEQ tablet Take 1 tablet (20 mEq total) by mouth daily. 05/23/12  Yes Brooke O Edmisten, PA-C  Rivaroxaban (XARELTO) 15 MG TABS tablet Take 15 mg by mouth daily with supper.   Yes Historical Provider, MD  triamcinolone cream (KENALOG) 0.5 % Apply 1 application topically 2 (two) times daily as needed. For rash   Yes Historical Provider, MD    History   Social History  . Marital Status: Widowed      Spouse Name: N/A    Number of Children: N/A  . Years of Education: N/A   Occupational History  . Not on file.   Social History Main Topics  . Smoking status: Never Smoker   . Smokeless tobacco: Never Used  . Alcohol Use: 0.6 oz/week    1 Glasses of wine per week     Comment: once a week  . Drug Use: No  . Sexually Active: Not on file   Other Topics Concern  . Not on file   Social History Narrative  . No narrative on file    Family History  Problem Relation Age of Onset  . Other      Patient does not remember    ROS: As stated in the HPI and negative for all other systems. (This is possibly inaccurate secondary to the patients dementia.)  Physical Exam: Blood pressure 107/70, pulse 89, temperature 97.4 F (36.3 C), temperature source Oral, resp. rate 17, SpO2 98.00%.  GENERAL:  Well appearing HEENT:  Pupils equal round and reactive, fundi not visualized, oral mucosa unremarkable NECK:  No jugular venous distention, waveform within normal limits, carotid upstroke brisk and symmetric, no bruits, no thyromegaly LYMPHATICS:  No cervical, inguinal adenopathy LUNGS:  Clear to auscultation bilaterally BACK:  No CVA tenderness CHEST:  Left pacemaker wound well healed.  Right pacemaker site with drainage from the lateral aspect with erythema from both the lateral and medial wound edges.   HEART:  PMI not displaced or sustained,S1 and S2 within normal limits, no S3, no S4, no clicks, no rubs, no murmurs ABD:  Flat, positive bowel sounds normal in frequency in pitch, no bruits, no rebound, no guarding, no midline pulsatile mass, no hepatomegaly, no splenomegaly EXT:  2 plus pulses throughout, trace ankle edema, no cyanosis no clubbing SKIN:  No rashes no nodules NEURO:  Cranial nerves II through XII grossly intact, motor grossly intact throughout PSYCH:  Cognitively intact, oriented to person place.    Labs: Lab Results  Component Value Date   BUN 34* 07/18/2012   Lab Results   Component Value Date   CREATININE 1.93* 07/18/2012   Lab Results  Component Value Date   NA 138 07/18/2012   K 3.8 07/18/2012   CL 98 07/18/2012   CO2 30 07/18/2012    Lab Results  Component Value Date   WBC 8.8 07/18/2012   HGB 12.1* 07/18/2012   HCT 35.1* 07/18/2012   MCV 93.6 07/18/2012   PLT 130* 07/18/2012    Radiology:  CXR:  1. No radiographic evidence of acute cardiopulmonary disease.  2. Old vertebral body compression fracture of the lower thoracic  spine (likely T11).  3. Atherosclerosis.     ASSESSMENT AND PLAN:  PACEMAKER INFECTION:  The patient will be placed on IV Vanc. Cultures of the wound and blood have already been drawn.  The decision will need to be made about extraction of this device.  Of note he was not eligible for the leadless pacemaker trial secondary to dementia.  CHF:  He seems to be euvolemic.  At this point, no change in therapy is indicated.  We have reviewed salt and fluid restrictions.  No further cardiovascular testing is indicated.  CKD Stage III:  Stable.  Follow creat.    ATRIAL FLUTTER/FIBRILLATION:  I continue Xarelto for now.      SignedRollene Rotunda 07/19/2012, 12:46 AM

## 2012-07-19 NOTE — Progress Notes (Signed)
UR Completed.  Spencer Ramirez [phone] 07/19/2012  

## 2012-07-19 NOTE — Progress Notes (Signed)
Subjective:  No c/o.  Not SOB, no chest pain.  Objective:  Vital Signs in the last 24 hours: BP 103/66  Pulse 61  Temp(Src) 97.9 F (36.6 C) (Oral)  Resp 16  Ht 5\' 10"  (1.778 m)  Wt 79.6 kg (175 lb 7.8 oz)  BMI 25.18 kg/m2  SpO2 99%  Physical Exam: Pleasant WM in NAD Lungs:  Clear  Cardiac:  Regular rhythm, normal S1 and S2, no S3, He has sechar on both sides of pacer pocket Extremities:  No edema present  Intake/Output from previous day: 06/02 0701 - 06/03 0700 In: -  Out: 500 [Urine:500] Weight Filed Weights   07/19/12 0200 07/19/12 0247  Weight: 81 kg (178 lb 9.2 oz) 79.6 kg (175 lb 7.8 oz)    Lab Results: Basic Metabolic Panel:  Recent Labs  16/10/96 2304 07/19/12 0420  NA 138 139  K 3.8 3.7  CL 98 103  CO2 30 30  GLUCOSE 86 88  BUN 34* 33*  CREATININE 1.93* 1.73*    CBC:  Recent Labs  07/18/12 2304  WBC 8.8  NEUTROABS 5.2  HGB 12.1*  HCT 35.1*  MCV 93.6  PLT 130*   BNP    Component Value Date/Time   PROBNP 237.0* 09/06/2007 1111    PROTIME: Lab Results  Component Value Date   INR 2.33* 07/18/2012   INR 2.01* 06/18/2012   INR 2.4* 06/16/2012    Telemetry:  atrial [paced with prolonged PR interval  Assessment/Plan:  1. 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 Cardiomyopathy  EF 40% on most recent ECHO last week 3. Long term treatment with Xarelto  Rec:  Awaiting debridement by Dr. Ladona Ridgel.  Continue antibiotics.  [name]  MD [facility]  07/19/2012, 12:33 PM

## 2012-07-19 NOTE — Progress Notes (Signed)
   ELECTROPHYSIOLOGY ROUNDING NOTE    Patient Name: Spencer Ramirez Date of Encounter: 07-19-2012    SUBJECTIVE:Patient feels well.  Denies chest pain or shortness of breath.  Denies fevers or chills.  Admitted yesterday for drainage from right sided pacemaker pocket.  Pus expressed in ER and cultures have been sent.   TELEMETRY: Reviewed telemetry pt in AV pacing Filed Vitals:   07/19/12 0145 07/19/12 0200 07/19/12 0247 07/19/12 0624  BP:  105/69 115/76 103/66  Pulse: 60 62 63 61  Temp:   97.9 F (36.6 C)   TempSrc:   Oral   Resp:   16   Height:  5' 6.93" (1.7 m) 5' 10" (1.778 m)   Weight:  178 lb 9.2 oz (81 kg) 175 lb 7.8 oz (79.6 kg)   SpO2: 100% 98% 99%     Intake/Output Summary (Last 24 hours) at 07/19/12 0711 Last data filed at 07/19/12 0328  Gross per 24 hour  Intake      0 ml  Output    500 ml  Net   -500 ml    LABS: Basic Metabolic Panel:  Recent Labs  07/18/12 2304 07/19/12 0420  NA 138 139  K 3.8 3.7  CL 98 103  CO2 30 30  GLUCOSE 86 88  BUN 34* 33*  CREATININE 1.93* 1.73*  CALCIUM 8.9 9.2   CBC:  Recent Labs  07/18/12 2304  WBC 8.8  NEUTROABS 5.2  HGB 12.1*  HCT 35.1*  MCV 93.6  PLT 130*   INR: 2.33  Radiology/Studies:  Dg Chest 2 View 07/18/2012   *RADIOLOGY REPORT*  Clinical Data: Chest pain.  CHEST - 2 VIEW  Comparison: Chest x-ray 06/18/2012.  Findings: Lung volumes are normal.  No consolidative airspace disease.  No pleural effusions.  No pneumothorax.  No pulmonary nodule or mass noted.  Pulmonary vasculature and the cardiomediastinal silhouette are within normal limits.  Right-sided pacemaker device in place with lead tips projecting over the expected location of the right atrium and right ventricular apex. Atherosclerotic calcifications within the thoracic aorta.  Lateral projection demonstrates the lower thoracic vertebral body compression fracture (likely T11), which appears similar to prior study.  IMPRESSION: 1. No radiographic  evidence of acute cardiopulmonary disease. 2.  Old vertebral body compression fracture of the lower thoracic spine (likely T11). 3.  Atherosclerosis.   Original Report Authenticated By: Daniel Entrikin, M.D.   PHYSICAL EXAM Well appearing elderly man, NAD HEENT: Unremarkable Neck:  No JVD, no thyromegally Lungs:  Clear with no wheezes, rales, or rhonchi. PPM incision well healed on the left. Incision on right with both medial and lateral wound margins with 3-5 mm scab. No erythema or drainage on my exam. No pocket fluctuance or tenderness to palpation. HEART:  Regular rate rhythm, no murmurs, no rubs, no clicks Abd:  Flat, positive bowel sounds, no organomegally, no rebound, no guarding Ext:  2 plus pulses, no edema, no cyanosis, no clubbing Skin:  No rashes no nodules Neuro:  CN II through XII intact, motor grossly intact  Tele - NSR  A/P 1. Stitch abscess on lateral and medial side of his incision 2. H/o symptomatic bradycardia s/p PPM 3. H/o poor hygiene Rec: Continue anti-biotics. I plan to debride incision in EP lab tomorrow. Hopefully he will not have involvement of his pocket which would result in need for device explant.   Gregg Taylor,M.D.     

## 2012-07-20 ENCOUNTER — Encounter (HOSPITAL_COMMUNITY)
Admission: EM | Disposition: A | Discharge status: Home / Self Care | Payer: Self-pay | Source: Home / Self Care | Attending: Cardiology

## 2012-07-20 DIAGNOSIS — T827XXA Infection and inflammatory reaction due to other cardiac and vascular devices, implants and grafts, initial encounter: Secondary | ICD-10-CM

## 2012-07-20 HISTORY — PX: POCKET REVISION: SHX5488

## 2012-07-20 LAB — BASIC METABOLIC PANEL
CO2: 29 mEq/L (ref 19–32)
Calcium: 9.4 mg/dL (ref 8.4–10.5)
Creatinine, Ser: 1.55 mg/dL — ABNORMAL HIGH (ref 0.50–1.35)
Glucose, Bld: 80 mg/dL (ref 70–99)

## 2012-07-20 LAB — WOUND CULTURE: Culture: NO GROWTH

## 2012-07-20 LAB — CBC
Hemoglobin: 13 g/dL (ref 13.0–17.0)
MCH: 32.3 pg (ref 26.0–34.0)
MCHC: 34.7 g/dL (ref 30.0–36.0)
MCV: 93.1 fL (ref 78.0–100.0)
Platelets: 131 10*3/uL — ABNORMAL LOW (ref 150–400)
WBC: 7.4 10*3/uL (ref 4.0–10.5)

## 2012-07-20 LAB — PROTIME-INR: Prothrombin Time: 22.5 seconds — ABNORMAL HIGH (ref 11.6–15.2)

## 2012-07-20 SURGERY — POCKET REVISION
Anesthesia: LOCAL

## 2012-07-20 MED ORDER — ONDANSETRON HCL 4 MG/2ML IJ SOLN: 4.0000 mg | Freq: Four times a day (QID) | INTRAMUSCULAR | Status: DC | PRN

## 2012-07-20 MED ORDER — MIDAZOLAM HCL 5 MG/5ML IJ SOLN
INTRAMUSCULAR | Status: AC
  Filled 2012-07-20: qty 5

## 2012-07-20 MED ORDER — FENTANYL CITRATE 0.05 MG/ML IJ SOLN
INTRAMUSCULAR | Status: AC
  Filled 2012-07-20: qty 2

## 2012-07-20 MED ORDER — VANCOMYCIN HCL 10 G IV SOLR
1250.0000 mg | INTRAVENOUS | Status: DC
  Administered 2012-07-20: 1250 mg via INTRAVENOUS
  Filled 2012-07-20 (×2): qty 1250

## 2012-07-20 MED ORDER — CEFAZOLIN SODIUM-DEXTROSE 2-3 GM-% IV SOLR
2.0000 g | Freq: Four times a day (QID) | INTRAVENOUS | Status: AC
  Administered 2012-07-20 – 2012-07-21 (×3): 2 g via INTRAVENOUS
  Filled 2012-07-20 (×3): qty 50

## 2012-07-20 MED ORDER — ACETAMINOPHEN 325 MG PO TABS: 325.0000 mg | ORAL_TABLET | ORAL | Status: DC | PRN

## 2012-07-20 MED ORDER — LIDOCAINE HCL (PF) 1 % IJ SOLN
INTRAMUSCULAR | Status: AC
  Filled 2012-07-20: qty 60

## 2012-07-20 NOTE — Progress Notes (Signed)
Subjective:  Dr. Lubertha Basque note read.  Superficial infection.  Not SOB, or chest pain.  Objective:  Vital Signs in the last 24 hours: BP 108/62  Pulse 60  Temp(Src) 98 F (36.7 C) (Oral)  Resp 18  Ht 5\' 10"  (1.778 m)  Wt 79.6 kg (175 lb 7.8 oz)  BMI 25.18 kg/m2  SpO2 98%  Physical Exam: Pleasant WM in NAD Lungs:  Clear  Cardiac:  Regular rhythm, normal S1 and S2, no S3, Sit bandages Extremities:  No edema present  Intake/Output from previous day: 06/03 0701 - 06/04 0700 In: 1140 [P.O.:1140] Out: 1052 [Urine:1050; Stool:2] Weight Filed Weights   07/19/12 0200 07/19/12 0247  Weight: 81 kg (178 lb 9.2 oz) 79.6 kg (175 lb 7.8 oz)    Lab Results: Basic Metabolic Panel:  Recent Labs  81/19/14 0420 07/20/12 0458  NA 139 140  K 3.7 3.7  CL 103 104  CO2 30 29  GLUCOSE 88 80  BUN 33* 30*  CREATININE 1.73* 1.55*    CBC:  Recent Labs  07/18/12 2304 07/20/12 0458  WBC 8.8 7.4  NEUTROABS 5.2  --   HGB 12.1* 13.0  HCT 35.1* 37.5*  MCV 93.6 93.1  PLT 130* 131*   BNP    Component Value Date/Time   PROBNP 237.0* 09/06/2007 1111    PROTIME: Lab Results  Component Value Date   INR 2.08* 07/20/2012   INR 2.33* 07/18/2012   INR 2.01* 06/18/2012    Telemetry:  atrial [paced with prolonged PR interval  Assessment/Plan:  1. 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 Cardiomyopathy  EF 40% on most recent ECHO last week 3. Long term treatment with Xarelto  Rec:  Hopefully d/c in am.  WiIll he need to be on antibiotics?  Spencer Palmer  MD [facility]  07/20/2012, 12:05 PM

## 2012-07-20 NOTE — Interval H&P Note (Signed)
History and Physical Interval Note:  07/20/2012 8:27 AM  Spencer Ramirez  has presented today for surgery, with the diagnosis of a  The various methods of treatment have been discussed with the patient and family. After consideration of risks, benefits and other options for treatment, the patient has consented to  Procedure(s): POCKET REVISION (N/A) as a surgical intervention .  The patient's history has been reviewed, patient examined, no change in status, stable for surgery.  I have reviewed the patient's chart and labs.  Questions were answered to the patient's satisfaction.     Lewayne Bunting

## 2012-07-20 NOTE — Progress Notes (Signed)
ANTIBIOTIC CONSULT NOTE - FOLLOW UP  Pharmacy Consult for Vancomycin Indication: pacer pocket infection  No Known Allergies  Patient Measurements: Height: 5\' 10"  (177.8 cm) Weight: 175 lb 7.8 oz (79.6 kg) IBW/kg (Calculated) : 73  Vital Signs: Temp: 98 F (36.7 C) (06/04 0946) Temp src: Oral (06/04 0946) BP: 95/57 mmHg (06/04 1300) Pulse Rate: 60 (06/04 0946) Intake/Output from previous day: 06/03 0701 - 06/04 0700 In: 1140 [P.O.:1140] Out: 1052 [Urine:1050; Stool:2] Intake/Output from this shift: Total I/O In: 141.7 [I.V.:141.7] Out: 200 [Urine:200]  Labs:  Recent Labs  07/18/12 2304 07/19/12 0420 07/20/12 0458  WBC 8.8  --  7.4  HGB 12.1*  --  13.0  PLT 130*  --  131*  CREATININE 1.93* 1.73* 1.55*   Estimated Creatinine Clearance: 39.9 ml/min (by C-G formula based on Cr of 1.55).  Microbiology: Recent Results (from the past 720 hour(s))  WOUND CULTURE     Status: None   Collection Time    07/18/12 11:16 PM      Result Value Range Status   Specimen Description WOUND   Final   Special Requests RIGHT SUBCLAVIAN   Final   Gram Stain     Final   Value: NO WBC SEEN     NO SQUAMOUS EPITHELIAL CELLS SEEN     NO ORGANISMS SEEN   Culture NO GROWTH 2 DAYS   Final   Report Status 07/20/2012 FINAL   Final  CULTURE, BLOOD (ROUTINE X 2)     Status: None   Collection Time    07/18/12 11:45 PM      Result Value Range Status   Specimen Description BLOOD RIGHT HAND   Final   Special Requests BOTTLES DRAWN AEROBIC AND ANAEROBIC 5CC EA   Final   Culture  Setup Time 07/19/2012 06:20   Final   Culture     Final   Value:        BLOOD CULTURE RECEIVED NO GROWTH TO DATE CULTURE WILL BE HELD FOR 5 DAYS BEFORE ISSUING A FINAL NEGATIVE REPORT   Report Status PENDING   Incomplete  CULTURE, BLOOD (ROUTINE X 2)     Status: None   Collection Time    07/18/12 11:48 PM      Result Value Range Status   Specimen Description BLOOD ARM LEFT   Final   Special Requests BOTTLES DRAWN  AEROBIC AND ANAEROBIC 10CC   Final   Culture  Setup Time 07/19/2012 06:19   Final   Culture     Final   Value:        BLOOD CULTURE RECEIVED NO GROWTH TO DATE CULTURE WILL BE HELD FOR 5 DAYS BEFORE ISSUING A FINAL NEGATIVE REPORT   Report Status PENDING   Incomplete   Assessment:  Day # 2 Vancomycin. Vancomycin 1500 mg IV given 6/3 ~5am, and 1250 mg IV planned for q48hrs, next 6/5 am.   S/p I&D today in EP lab.  Ancef 2 grams IV given pre-procedure, and to continue q6h x 3 doses post-procedure.  Wound culture negative, afebrile, WBC 7.4.  Creatinine has trended down.    Goal of Therapy:  Vancomycin trough level 15-20 mcg/ml  Plan:   Will adjust Vancomycin 1250 mg IV from q48hrs to q24hrs.  Next dose this afternoon.  Follow renal function, culture data, and further antibiotic plans.  Spencer Ramirez, [place] Pager: [phone] 07/20/2012,2:00 PM

## 2012-07-20 NOTE — Op Note (Signed)
EP Procedure Noted  Procedure: Pacemaker pocket revision Indication: Stitch abscess, possibly involving entire pocket Findings: After informed consent was obtained, the patient was taken to the EP lab in the fasting state. 20 cc of lidocaine was infiltrated around the medial and lateral aspects of the incision. 1 mg of Versed was given. A one cm incision was carried out around the medial and lateral aspects of the healed PM incision. Electrocautery was utilized to remove the necrotic tissue which was superficial. There was no communication with the pocket visualized and with pressure applied to the pocket, no fluid seeped through either of the 1 cm incisions. The superficial incisions were irrigated and pressure applied to obtain hemostasis. Benzoin and steri-strips were applied to the incision and the patient returned to the room in satisfactory condition.  Complications: none immediately.  [name].D.

## 2012-07-20 NOTE — H&P (View-Only) (Signed)
   ELECTROPHYSIOLOGY ROUNDING NOTE    Patient Name: Spencer Ramirez Date of Encounter: 07-19-2012    SUBJECTIVE:Patient feels well.  Denies chest pain or shortness of breath.  Denies fevers or chills.  Admitted yesterday for drainage from right sided pacemaker pocket.  Pus expressed in ER and cultures have been sent.   TELEMETRY: Reviewed telemetry pt in AV pacing Filed Vitals:   07/19/12 0145 07/19/12 0200 07/19/12 0247 07/19/12 0624  BP:  105/69 115/76 103/66  Pulse: 60 62 63 61  Temp:   97.9 F (36.6 C)   TempSrc:   Oral   Resp:   16   Height:  5' 6.93" (1.7 m) 5\' 10"  (1.778 m)   Weight:  178 lb 9.2 oz (81 kg) 175 lb 7.8 oz (79.6 kg)   SpO2: 100% 98% 99%     Intake/Output Summary (Last 24 hours) at 07/19/12 0711 Last data filed at 07/19/12 0328  Gross per 24 hour  Intake      0 ml  Output    500 ml  Net   -500 ml    LABS: Basic Metabolic Panel:  Recent Labs  78/29/56 2304 07/19/12 0420  NA 138 139  K 3.8 3.7  CL 98 103  CO2 30 30  GLUCOSE 86 88  BUN 34* 33*  CREATININE 1.93* 1.73*  CALCIUM 8.9 9.2   CBC:  Recent Labs  07/18/12 2304  WBC 8.8  NEUTROABS 5.2  HGB 12.1*  HCT 35.1*  MCV 93.6  PLT 130*   INR: 2.33  Radiology/Studies:  Dg Chest 2 View 07/18/2012   *RADIOLOGY REPORT*  Clinical Data: Chest pain.  CHEST - 2 VIEW  Comparison: Chest x-ray 06/18/2012.  Findings: Lung volumes are normal.  No consolidative airspace disease.  No pleural effusions.  No pneumothorax.  No pulmonary nodule or mass noted.  Pulmonary vasculature and the cardiomediastinal silhouette are within normal limits.  Right-sided pacemaker device in place with lead tips projecting over the expected location of the right atrium and right ventricular apex. Atherosclerotic calcifications within the thoracic aorta.  Lateral projection demonstrates the lower thoracic vertebral body compression fracture (likely T11), which appears similar to prior study.  IMPRESSION: 1. No radiographic  evidence of acute cardiopulmonary disease. 2.  Old vertebral body compression fracture of the lower thoracic spine (likely T11). 3.  Atherosclerosis.   Original Report Authenticated By: Trudie Reed, M.D.   PHYSICAL EXAM Well appearing elderly man, NAD HEENT: Unremarkable Neck:  No JVD, no thyromegally Lungs:  Clear with no wheezes, rales, or rhonchi. PPM incision well healed on the left. Incision on right with both medial and lateral wound margins with 3-5 mm scab. No erythema or drainage on my exam. No pocket fluctuance or tenderness to palpation. HEART:  Regular rate rhythm, no murmurs, no rubs, no clicks Abd:  Flat, positive bowel sounds, no organomegally, no rebound, no guarding Ext:  2 plus pulses, no edema, no cyanosis, no clubbing Skin:  No rashes no nodules Neuro:  CN II through XII intact, motor grossly intact  Tele - NSR  A/P 1. Stitch abscess on lateral and medial side of his incision 2. H/o symptomatic bradycardia s/p PPM 3. H/o poor hygiene Rec: Continue anti-biotics. I plan to debride incision in EP lab tomorrow. Hopefully he will not have involvement of his pocket which would result in need for device explant.   [name].D.

## 2012-07-21 MED ORDER — CEPHALEXIN 250 MG PO CAPS
250.0000 mg | ORAL_CAPSULE | Freq: Four times a day (QID) | ORAL | Status: DC
  Filled 2012-07-21 (×4): qty 1

## 2012-07-21 MED ORDER — CEPHALEXIN 500 MG PO CAPS
500.0000 mg | ORAL_CAPSULE | Freq: Three times a day (TID) | ORAL | Status: DC
  Administered 2012-07-21: 500 mg via ORAL
  Filled 2012-07-21 (×3): qty 1

## 2012-07-21 MED ORDER — CEPHALEXIN 250 MG PO CAPS: 250.0000 mg | ORAL_CAPSULE | Freq: Four times a day (QID) | ORAL | Status: DC

## 2012-07-21 MED ORDER — CEPHALEXIN 500 MG PO CAPS: 500.0000 mg | ORAL_CAPSULE | Freq: Three times a day (TID) | ORAL | Status: DC

## 2012-07-21 NOTE — Progress Notes (Signed)
   ELECTROPHYSIOLOGY ROUNDING NOTE    Patient Name: Spencer Ramirez Date of Encounter: 07-21-2012    SUBJECTIVE:Patient feels well.  No chest pain or shortness of breath.  S/p pacemaker pocket debridement yesterday.   TELEMETRY: Reviewed telemetry pt in atrial pacing with intrinsic ventricular conduction Filed Vitals:   07/20/12 1300 07/20/12 1700 07/20/12 1951 07/21/12 0459  BP: 95/57 101/67 107/60 119/74  Pulse:  62 61 72  Temp:   97.7 F (36.5 C) 97.5 F (36.4 C)  TempSrc:   Oral Oral  Resp:   18 19  Height:      Weight:      SpO2:   100% 98%    Intake/Output Summary (Last 24 hours) at 07/21/12 0701 Last data filed at 07/20/12 1706  Gross per 24 hour  Intake 1161.67 ml  Output    850 ml  Net 311.67 ml    LABS: Basic Metabolic Panel:  Recent Labs  32/44/01 0420 07/20/12 0458  NA 139 140  K 3.7 3.7  CL 103 104  CO2 30 29  GLUCOSE 88 80  BUN 33* 30*  CREATININE 1.73* 1.55*  CALCIUM 9.2 9.4   CBC:  Recent Labs  07/18/12 2304 07/20/12 0458  WBC 8.8 7.4  NEUTROABS 5.2  --   HGB 12.1* 13.0  HCT 35.1* 37.5*  MCV 93.6 93.1  PLT 130* 131*   INR: 2.08 yesterday  Radiology/Studies:  Dg Chest 2 View 07/18/2012   *RADIOLOGY REPORT*  Clinical Data: Chest pain.  CHEST - 2 VIEW  Comparison: Chest x-ray 06/18/2012.  Findings: Lung volumes are normal.  No consolidative airspace disease.  No pleural effusions.  No pneumothorax.  No pulmonary nodule or mass noted.  Pulmonary vasculature and the cardiomediastinal silhouette are within normal limits.  Right-sided pacemaker device in place with lead tips projecting over the expected location of the right atrium and right ventricular apex. Atherosclerotic calcifications within the thoracic aorta.  Lateral projection demonstrates the lower thoracic vertebral body compression fracture (likely T11), which appears similar to prior study.  IMPRESSION: 1. No radiographic evidence of acute cardiopulmonary disease. 2.  Old vertebral  body compression fracture of the lower thoracic spine (likely T11). 3.  Atherosclerosis.   Original Report Authenticated By: Trudie Reed, M.D.    PHYSICAL EXAM Right chest with pressure dressing   Wound care, restrictions reviewed with patient.  Patient advised not to shower until seen in office for wound check appointment.   A/P 1. Pacemaker stitch abcess 2. S/p incision debridement 3. H/o symptomatic bradycardia Rec: ok for discharge home. Ok with anti-biotics. I have asked the patient and his daughter to wash his incision twice a day.  [name].D.

## 2012-07-21 NOTE — Progress Notes (Signed)
Reviewed discharge instruction with patients grandaughter his caregiver, answered all questions instructions and prescriptons provided Georgette Dover

## 2012-07-21 NOTE — Discharge Summary (Signed)
Physician Discharge Summary  Patient ID: Spencer Ramirez MRN: [id number] DOB/AGE: [birth year] 79 y.o.  Admit date: 07/18/2012 Discharge date: 07/21/2012  Primary Physician: Dr. Windle Guard Primary Discharge Diagnosis: 1. Infection of previous pacemaker incision  Secondary Discharge Diagnosis: 2. Atrial flutter currently in sinus rhythm with previous ablation 3. 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 Stage III 4 chronic kidney disease 5 for long-term anticoagulation with XARELTO 6. Dementia  Procedures:  And debridement next duration of previously placed pacemaker  Consults:  Dr. Nena Jordan Course: The patient has a history of SSS with pacemaker placement 03/24/12 for sinus pauses up to 9 seconds with indication for beta blocker given his ischemic cardiomyopathy. This was complicated by a pacemaker infection. He had extraction of this with insertion of a temporary permanent pacemaker with a course of appropriate antibiotics. He subsequently returned for replacement of the pacemaker on the right side on 06/17/12. He now returns for follow up after his family noticed drainage from the device. He was seen recently in the clinic by Dr. Donnie Aho and there was some slight erythema noted on the medial edge of the scar. However, there was no obvious infection, drainage or systemic symptoms of infection. Tonight his granddaughter noticed that the wound was draining. He was seen in the ER and the Dr. Norlene Campbell expressed 2 - 3 cc of pus. He denies any fever or chills. He denies any pain. The patient denies any new symptoms such as chest discomfort, neck or arm discomfort. There has been no new shortness of breath, PND or orthopnea. There have been no reported palpitations, presyncope or syncope. History is limited by dementia. Of note his granddaughter suspect that he does scratch the wound as he has scratches different areas of his chest and arms.   The patient was admitted  with a presented infection of the recently placed pacemaker which had been moved to the other side because of a previous pacemaker infection. He was placed on intravenous antibiotics with vancomycin.blood cultures were negative.the wound culture done with pus expressed on admission showed no growth at 2 days. Dr. Sharrell Ku saw the patient consultation and took him to the lab for debridement. He found what was felt to the stitch abscesses without involvement of the pacemaker pocket. This was debrided and was going to be allowed to heal through secondary intention. He was seen the next morning and was felt to be stable for discharge. He will be discharged on cephalexin 250 mg 4 times a day and is also instructed to wash the wound with soap and water and to keep a sterile dressing on it. He will follow up with Dr. Ladona Ridgel for a check of the wound in one week. I will see him in followup in 2 weeks. His granddaughter who is quite attentive will let us know if there is any further drainage or issues.   Discharge Exam: Blood pressure 119/74, pulse 72, temperature 97.5 F (36.4 C), temperature source Oral, resp. rate 19, height 5\' 10"  (1.778 m), weight 79.6 kg (175 lb 7.8 oz), SpO2 98.00%.   Lungs clear, no S3. The pacemaker site showed some healthy-appearing tissue at the site of previous debridement without drainage.  Labs: CBC:   Lab Results  Component Value Date   WBC 7.4 07/20/2012   HGB 13.0 07/20/2012   HCT 37.5* 07/20/2012   MCV 93.1 07/20/2012   PLT 131* 07/20/2012   CMP:  Recent Labs Lab 07/20/12 0458  NA 140  K 3.7  CL 104  CO2 29  BUN 30*  CREATININE 1.55*  CALCIUM 9.4  GLUCOSE 80   Lipid Panel     Component Value Date/Time   CHOL 184 03/25/2012 0630   TRIG 63 03/25/2012 0630   HDL 48 03/25/2012 0630   CHOLHDL 3.8 03/25/2012 0630   VLDL 13 03/25/2012 0630   LDLCALC 123* 03/25/2012 0630     Radiology: No acute coronary disease, evidence of atherosclerosis, old compression  fracture  EKG: Normal sinus rhythm, nonspecific ST and T-wave changes  Discharge Medications:   Medication List    TAKE these medications       acetaminophen 500 MG tablet  Commonly known as:  TYLENOL  Take 500 mg by mouth every 6 (six) hours as needed for pain or fever.     calcium carbonate 600 MG Tabs  Commonly known as:  OS-CAL  Take 600 mg by mouth daily.     carvedilol 6.25 MG tablet  Commonly known as:  COREG  Take 6.25 mg by mouth 2 (two) times daily with a meal.     cephALEXin 500 MG capsule  Commonly known as:  KEFLEX  Take 1 capsule (500 mg total) by mouth 3 (three) times daily.     furosemide 40 MG tablet  Commonly known as:  LASIX  Take 80 mg by mouth every morning.     hydrOXYzine 10 MG tablet  Commonly known as:  ATARAX/VISTARIL  Take 10 mg by mouth every 6 (six) hours as needed for itching.     lisinopril 10 MG tablet  Commonly known as:  PRINIVIL,ZESTRIL  Take 5 mg by mouth every morning.     multivitamin with minerals Tabs  Take 1 tablet by mouth daily.     potassium chloride SA 20 MEQ tablet  Commonly known as:  K-DUR,KLOR-CON  Take 1 tablet (20 mEq total) by mouth daily.     Rivaroxaban 15 MG Tabs tablet  Commonly known as:  XARELTO  Take 15 mg by mouth daily with supper.     triamcinolone cream 0.5 %  Commonly known as:  KENALOG  Apply 1 application topically 2 (two) times daily as needed. For rash        Followup plans and appointments: He is to call Dr. Lubertha Basque office for an appointment in one week. Followup with Dr. Donnie Aho in 2 weeks.    Time spent with patient to include physician time:30 minutes  Signed: [name]. MD [facility] 07/21/2012, 10:54 AM

## 2012-07-25 LAB — CULTURE, BLOOD (ROUTINE X 2): Culture: NO GROWTH

## 2012-07-27 ENCOUNTER — Telehealth: Payer: Self-pay | Admitting: Internal Medicine

## 2012-07-27 NOTE — Telephone Encounter (Signed)
New Prob      Calling to request an extension of his orders. Please calll.

## 2012-07-27 NOTE — Telephone Encounter (Signed)
lmom for [facility] to return call in regards to ordres

## 2012-07-28 ENCOUNTER — Ambulatory Visit (INDEPENDENT_AMBULATORY_CARE_PROVIDER_SITE_OTHER): Payer: Medicare Other | Admitting: *Deleted

## 2012-07-28 DIAGNOSIS — I495 Sick sinus syndrome: Secondary | ICD-10-CM

## 2012-07-28 LAB — PACEMAKER DEVICE OBSERVATION

## 2012-07-28 NOTE — Telephone Encounter (Signed)
Follow Up  ° ° ° ° ° °Calling back regarding order for extension: 1 time a week for 4 weeks and a few PRN visits if needed. Please call. ° °

## 2012-07-29 NOTE — Telephone Encounter (Signed)
Follow Up       Calling back regarding order for extension: 1 time a week for 4 weeks and a few PRN visits if needed. Please call.

## 2012-07-29 NOTE — Telephone Encounter (Signed)
Verbal order left on voice of [place]

## 2012-08-03 ENCOUNTER — Encounter: Payer: Self-pay | Admitting: Internal Medicine

## 2012-08-03 ENCOUNTER — Ambulatory Visit (INDEPENDENT_AMBULATORY_CARE_PROVIDER_SITE_OTHER): Payer: Medicare Other | Admitting: *Deleted

## 2012-08-03 DIAGNOSIS — I495 Sick sinus syndrome: Secondary | ICD-10-CM

## 2012-08-03 NOTE — Progress Notes (Signed)
Wound recheck in clinic. Pt implanted 06-17-12. Strips removed and re-bandaged. Pt scheduled for 08-05-12 @ 930 for wound recheck.

## 2012-08-08 ENCOUNTER — Ambulatory Visit: Payer: Medicare Other | Admitting: *Deleted

## 2012-08-08 DIAGNOSIS — Z5189 Encounter for other specified aftercare: Secondary | ICD-10-CM

## 2012-08-08 LAB — PACEMAKER DEVICE OBSERVATION

## 2012-08-08 NOTE — Progress Notes (Signed)
Wound recheck-- bandages removed and pt healing better.  ROV 09/20/12 w/ GT for 90 day post implant f/u.

## 2012-08-31 ENCOUNTER — Encounter: Payer: Self-pay | Admitting: Internal Medicine

## 2012-09-19 ENCOUNTER — Encounter: Payer: Self-pay | Admitting: *Deleted

## 2012-09-20 ENCOUNTER — Encounter: Payer: Self-pay | Admitting: Internal Medicine

## 2012-09-20 ENCOUNTER — Ambulatory Visit (INDEPENDENT_AMBULATORY_CARE_PROVIDER_SITE_OTHER): Payer: Medicare Other | Admitting: Internal Medicine

## 2012-09-20 VITALS — BP 91/63 | HR 90 | Ht 66.5 in | Wt 175.0 lb

## 2012-09-20 DIAGNOSIS — I509 Heart failure, unspecified: Secondary | ICD-10-CM

## 2012-09-20 DIAGNOSIS — Z950 Presence of cardiac pacemaker: Secondary | ICD-10-CM

## 2012-09-20 DIAGNOSIS — I495 Sick sinus syndrome: Secondary | ICD-10-CM

## 2012-09-20 DIAGNOSIS — I5022 Chronic systolic (congestive) heart failure: Secondary | ICD-10-CM

## 2012-09-20 LAB — PACEMAKER DEVICE OBSERVATION
AL IMPEDENCE PM: 432 Ohm
AL THRESHOLD: 0.5 V
ATRIAL PACING PM: 80
BAMS-0001: 150 {beats}/min
RV LEAD AMPLITUDE: 5.6 mv
RV LEAD THRESHOLD: 0.5 V

## 2012-09-20 NOTE — Assessment & Plan Note (Signed)
His Medtronic dual-chamber pacemaker is working normally. We'll plan to recheck in Dr. York Spaniel office in several months.

## 2012-09-20 NOTE — Progress Notes (Signed)
HPI Mr. Spencer Ramirez returns today for followup. He is a very pleasant 79 year old man with a history of symptomatic bradycardia and syncope secondary to long pauses.he underwent initial pacemaker insertion several months ago and this procedure was complicated by a pacemaker pocket infection. There have been questions about hygiene. He underwent extraction of his initial pacemaker and insertion of a new pacemaker on the contralateral side approximately 2 months ago. He also had a stitch abscess from this pacemaker which was treated conservatively and successfully. He has had no drainage from his current pacemaker incision and denies fevers and chills. No recurrent syncope. No Known Allergies   Current Outpatient Prescriptions  Medication Sig Dispense Refill  . acetaminophen (TYLENOL) 500 MG tablet Take 500 mg by mouth every 6 (six) hours as needed for pain or fever.       . calcium carbonate (OS-CAL) 600 MG TABS Take 600 mg by mouth daily.      . carvedilol (COREG) 6.25 MG tablet Take 6.25 mg by mouth 2 (two) times daily with a meal.      . furosemide (LASIX) 40 MG tablet Take 80 mg by mouth every morning.       . hydrOXYzine (ATARAX/VISTARIL) 10 MG tablet Take 10 mg by mouth every 6 (six) hours as needed for itching.       Marland Kitchen lisinopril (PRINIVIL,ZESTRIL) 10 MG tablet Take 5 mg by mouth every morning.       . Multiple Vitamin (MULTIVITAMIN WITH MINERALS) TABS Take 1 tablet by mouth daily.      . potassium chloride SA (K-DUR,KLOR-CON) 20 MEQ tablet Take 1 tablet (20 mEq total) by mouth daily.      . Rivaroxaban (XARELTO) 15 MG TABS tablet Take 15 mg by mouth daily with supper.      . triamcinolone cream (KENALOG) 0.5 % Apply 1 application topically 2 (two) times daily as needed. For rash       No current facility-administered medications for this visit.     Past Medical History  Diagnosis Date  . Chronic systolic CHF (congestive heart failure)   . Chronic venous insufficiency   . Chronic kidney  disease     Stg 3  . Anticoagulant long-term use   . Dementia   . Sleep apnea   . Myocardial infarction   . Coronary artery disease   . Sick sinus syndrome   . Syncope     Secondary to SSS.  . Orthostatic hypotension   . Atrial flutter   . Atrial fibrillation     ROS:   All systems reviewed and negative except as noted in the HPI.   Past Surgical History  Procedure Laterality Date  . Tonsillectomy    . Cardioversion  09/17/2011    Procedure: CARDIOVERSION;  Surgeon: Othella Boyer, MD;  Location: [facility] ENDOSCOPY;  Service: Cardiovascular;  Laterality: N/A;  anesth start time 1005  . Pacemaker insertion  03/25/2012    Medtronic Adapta L dual-chamber pacemaker, serial number [id number]   . Pacemaker revision  06/17/2012    Medtronic Sensia dual-chamber pacemaker, serial number [id number]      Family History  Problem Relation Age of Onset  . Other      Patient does not remember     History   Social History  . Marital Status: Widowed    Spouse Name: N/A    Number of Children: N/A  . Years of Education: N/A   Occupational History  . Not on file.  Social History Main Topics  . Smoking status: Never Smoker   . Smokeless tobacco: Never Used  . Alcohol Use: 0.6 oz/week    1 Glasses of wine per week     Comment: once a week  . Drug Use: No  . Sexually Active: Not on file   Other Topics Concern  . Not on file   Social History Narrative  . No narrative on file     BP 91/63  Pulse 90  Ht 5' 6.5" (1.689 m)  Wt 175 lb (79.379 kg)  BMI 27.83 kg/m2  Physical Exam:  Well appearing elderly man,NAD HEENT: Unremarkable Neck:  6 cm JVD, no thyromegally Back:  No CVA tenderness Lungs:  Clear with no wheezes, rales, or rhonchi. Well-healed  Pacemaker incision. HEART:  Regular rate rhythm, no murmurs, no rubs, no clicks Abd:  soft, positive bowel sounds, no organomegally, no rebound, no guarding Ext:  2 plus pulses, no edema, no cyanosis, no clubbing Skin:  No  rashes no nodules Neuro:  CN II through XII intact, motor grossly intact  DEVICE  Normal device function.  See PaceArt for details.   Assess/Plan:

## 2012-09-20 NOTE — Assessment & Plan Note (Signed)
His chronic  Heart failure is well compensated. No change in medical therapy. He will maintain a low-sodium diet.

## 2012-09-20 NOTE — Patient Instructions (Addendum)
Your physician recommends that you schedule a follow-up appointment as needed with Dr Ladona Ridgel  Keep your follow ups with Dr Donnie Aho

## 2013-03-15 ENCOUNTER — Encounter (INDEPENDENT_AMBULATORY_CARE_PROVIDER_SITE_OTHER): Payer: Self-pay | Admitting: General Surgery

## 2013-03-15 ENCOUNTER — Ambulatory Visit (INDEPENDENT_AMBULATORY_CARE_PROVIDER_SITE_OTHER): Payer: Medicare Other | Admitting: General Surgery

## 2013-03-15 ENCOUNTER — Encounter (INDEPENDENT_AMBULATORY_CARE_PROVIDER_SITE_OTHER): Payer: Self-pay

## 2013-03-15 VITALS — BP 120/82 | HR 90 | Temp 97.0°F | Resp 18 | Wt 185.0 lb

## 2013-03-15 DIAGNOSIS — K648 Other hemorrhoids: Secondary | ICD-10-CM

## 2013-03-15 DIAGNOSIS — K641 Second degree hemorrhoids: Secondary | ICD-10-CM | POA: Insufficient documentation

## 2013-03-15 NOTE — Progress Notes (Signed)
Chief Complaint  Patient presents with  . Hemorrhoids    HISTORY: Spencer Ramirez is a 80 y.o. male who presents to the office with rectal bleeding. He underwent a colonoscopy with Dr Evette CristalGanem and an anal polyp was found along with hemorrhoids.  He denies any major bleeding with BM's.  Past Medical History  Diagnosis Date  . Chronic systolic CHF (congestive heart failure)   . Chronic venous insufficiency   . Chronic kidney disease     Stg 3  . Anticoagulant long-term use   . Dementia   . Sleep apnea   . Myocardial infarction   . Coronary artery disease   . Sick sinus syndrome   . Syncope     Secondary to SSS.  . Orthostatic hypotension   . Atrial flutter   . Atrial fibrillation       Past Surgical History  Procedure Laterality Date  . Tonsillectomy    . Cardioversion  09/17/2011    Procedure: CARDIOVERSION;  Surgeon: Othella BoyerWilliam S Tilley, MD;  [facility];  Service: Cardiovascular;  Laterality: N/A;  anesth start time 1005  . Pacemaker insertion  03/25/2012    Medtronic Adapta L dual-chamber pacemaker, serial number [id number]   . Pacemaker revision  06/17/2012    Medtronic Sensia dual-chamber pacemaker, serial number [id number]         Current Outpatient Prescriptions  Medication Sig Dispense Refill  . acetaminophen (TYLENOL) 500 MG tablet Take 500 mg by mouth every 6 (six) hours as needed for pain or fever.       . calcium carbonate (OS-CAL) 600 MG TABS Take 600 mg by mouth daily.      . carvedilol (COREG) 6.25 MG tablet Take 6.25 mg by mouth 2 (two) times daily with a meal.      . furosemide (LASIX) 40 MG tablet Take 80 mg by mouth every morning.       . hydrOXYzine (ATARAX/VISTARIL) 10 MG tablet Take 10 mg by mouth every 6 (six) hours as needed for itching.       Marland Kitchen. lisinopril (PRINIVIL,ZESTRIL) 10 MG tablet Take 5 mg by mouth every morning.       . Multiple Vitamin (MULTIVITAMIN WITH MINERALS) TABS Take 1 tablet by mouth daily.      . potassium chloride SA  (K-DUR,KLOR-CON) 20 MEQ tablet Take 1 tablet (20 mEq total) by mouth daily.      . Rivaroxaban (XARELTO) 15 MG TABS tablet Take 15 mg by mouth daily with supper.      . triamcinolone cream (KENALOG) 0.5 % Apply 1 application topically 2 (two) times daily as needed. For rash       No current facility-administered medications for this visit.      No Known Allergies    Family History  Problem Relation Age of Onset  . Other      Patient does not remember    History   Social History  . Marital Status: Widowed    Spouse Name: N/A    Number of Children: N/A  . Years of Education: N/A   Social History Main Topics  . Smoking status: Never Smoker   . Smokeless tobacco: Never Used  . Alcohol Use: 0.6 oz/week    1 Glasses of wine per week     Comment: once a week  . Drug Use: No  . Sexual Activity: None   Other Topics Concern  . None   Social History Narrative  . None  REVIEW OF SYSTEMS - PERTINENT POSITIVES ONLY: Review of Systems - General ROS: negative for - chills, fever or weight loss Hematological and Lymphatic ROS: negative for - bleeding problems, blood clots or bruising Respiratory ROS: no cough, shortness of breath, or wheezing Cardiovascular ROS: no chest pain or dyspnea on exertion Gastrointestinal ROS: no abdominal pain, change in bowel habits, or black or bloody stools Genito-Urinary ROS: no dysuria, trouble voiding, or hematuria  EXAM: Filed Vitals:   03/15/13 1407  BP: 120/82  Pulse: 90  Temp: 97 F (36.1 C)  Resp: 18    General appearance: alert and cooperative Resp: clear to auscultation bilaterally Cardio: irregularly irregular rhythm GI: normal findings: soft, non-tender   Procedure: Anoscopy Surgeon: Maisie Fus Diagnosis: anal polyp  Assistant: Christella Scheuermann After the risks and benefits were explained, verbal consent was obtained for above procedure  Anesthesia: none Findings: grade 2 internal hemorrhoids (RP and RA), No definitive mass palpated  or seen on anoscopy.  There is an area that feels slightly different that the surrounding tissue in the R posterior hemorrhoid tissue.  This does no appear abnormal on anoscopic exam.      ASSESSMENT AND PLAN:  Spencer Ramirez Is an 80 year old male who presents to the office with an abnormal finding on colonoscopy. He was noted to have a palpable rectal mass that is soft and rubbery just inside the anal area. I do not feel anything distinctly like this. There is an area that feels slightly different in the right posterior hemorrhoidal tissue but it does not appear to be concerning on anoscopic exam.  I offered to repeat the flexible sigmoid to evaluate this further and try to identify exactly what Dr. Evette Cristal saw. Patient declined.  I will see him back on as needed basis. If he continues to have rectal bleeding we may need to add a hemorrhoid suppository medication, but for now I think he can continue with his high-fiber diet.   Vanita Panda, MD Colon and Rectal Surgery / General Surgery [facility]      Visit Diagnoses: 1. Prolapsed internal hemorrhoids, grade 2     Primary Care Physician: Kaleen Mask, MD

## 2013-03-15 NOTE — Patient Instructions (Signed)

## 2013-03-20 ENCOUNTER — Ambulatory Visit
Admission: RE | Admit: 2013-03-20 | Discharge: 2013-03-20 | Disposition: A | Discharge status: Home / Self Care | Payer: Medicare Other | Source: Ambulatory Visit | Attending: Family Medicine | Admitting: Family Medicine

## 2013-03-20 ENCOUNTER — Other Ambulatory Visit: Payer: Self-pay | Admitting: Family Medicine

## 2013-03-20 DIAGNOSIS — M25552 Pain in left hip: Secondary | ICD-10-CM

## 2013-03-30 ENCOUNTER — Ambulatory Visit (INDEPENDENT_AMBULATORY_CARE_PROVIDER_SITE_OTHER): Payer: TRICARE For Life (TFL) | Admitting: General Surgery

## 2013-04-14 ENCOUNTER — Encounter (INDEPENDENT_AMBULATORY_CARE_PROVIDER_SITE_OTHER): Payer: Self-pay

## 2014-01-25 ENCOUNTER — Encounter (HOSPITAL_COMMUNITY): Payer: Self-pay | Admitting: Internal Medicine

## 2014-03-14 ENCOUNTER — Ambulatory Visit (INDEPENDENT_AMBULATORY_CARE_PROVIDER_SITE_OTHER): Payer: Medicare Other | Admitting: Neurology

## 2014-03-14 ENCOUNTER — Encounter: Payer: Self-pay | Admitting: Neurology

## 2014-03-14 VITALS — BP 97/65 | HR 86 | Ht 66.0 in | Wt 175.4 lb

## 2014-03-14 DIAGNOSIS — R5383 Other fatigue: Secondary | ICD-10-CM

## 2014-03-14 DIAGNOSIS — R5381 Other malaise: Secondary | ICD-10-CM

## 2014-03-14 DIAGNOSIS — R413 Other amnesia: Secondary | ICD-10-CM | POA: Insufficient documentation

## 2014-03-14 DIAGNOSIS — E538 Deficiency of other specified B group vitamins: Secondary | ICD-10-CM

## 2014-03-14 HISTORY — DX: Other amnesia: R41.3

## 2014-03-14 IMAGING — CR DG CHEST 2V
2 series · 2 of 2 positions shown · non-contrast
Comparison: 05/19/2012

CLINICAL DATA: pacer insertion

CHEST - 2 VIEW

[w chest pa]
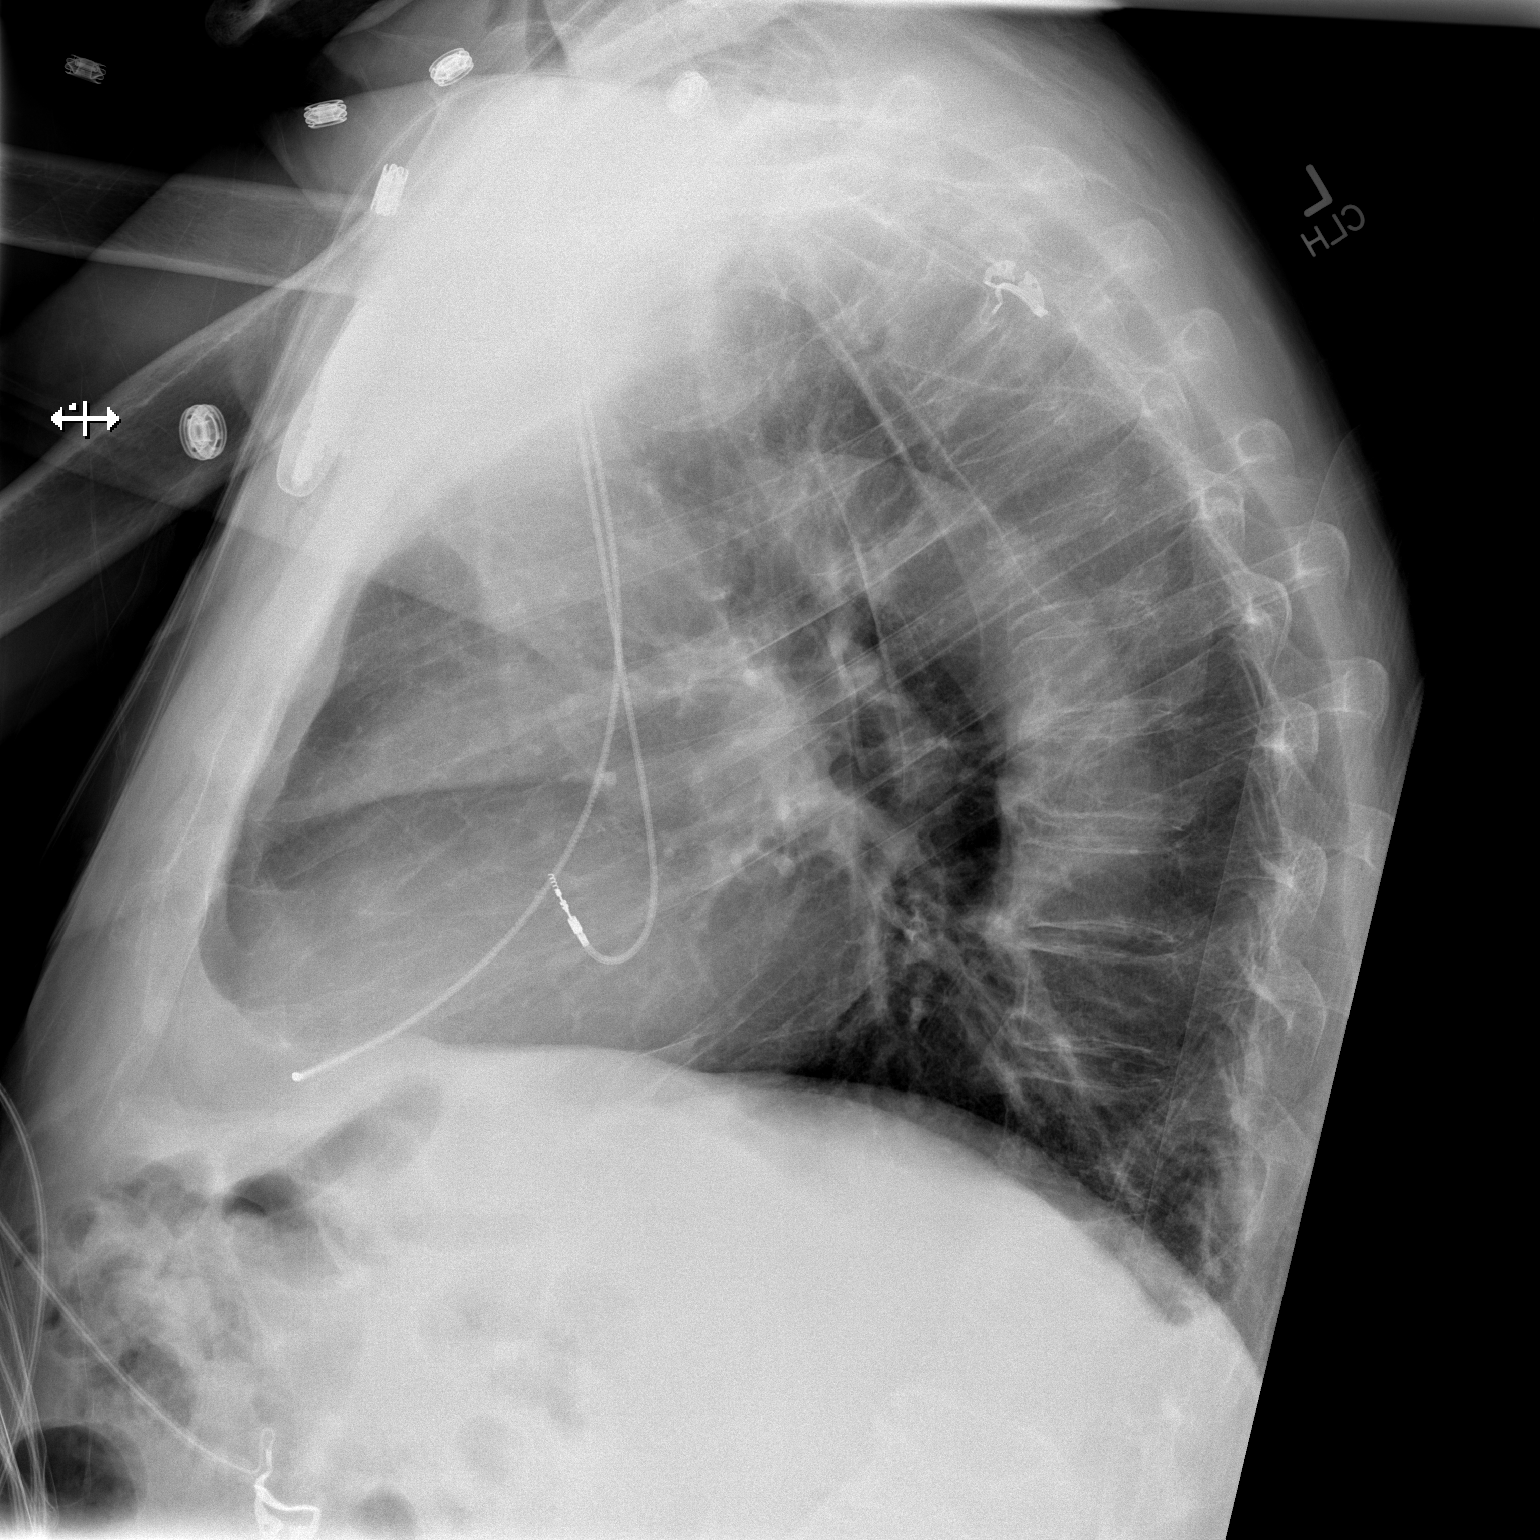

[x chest ap]
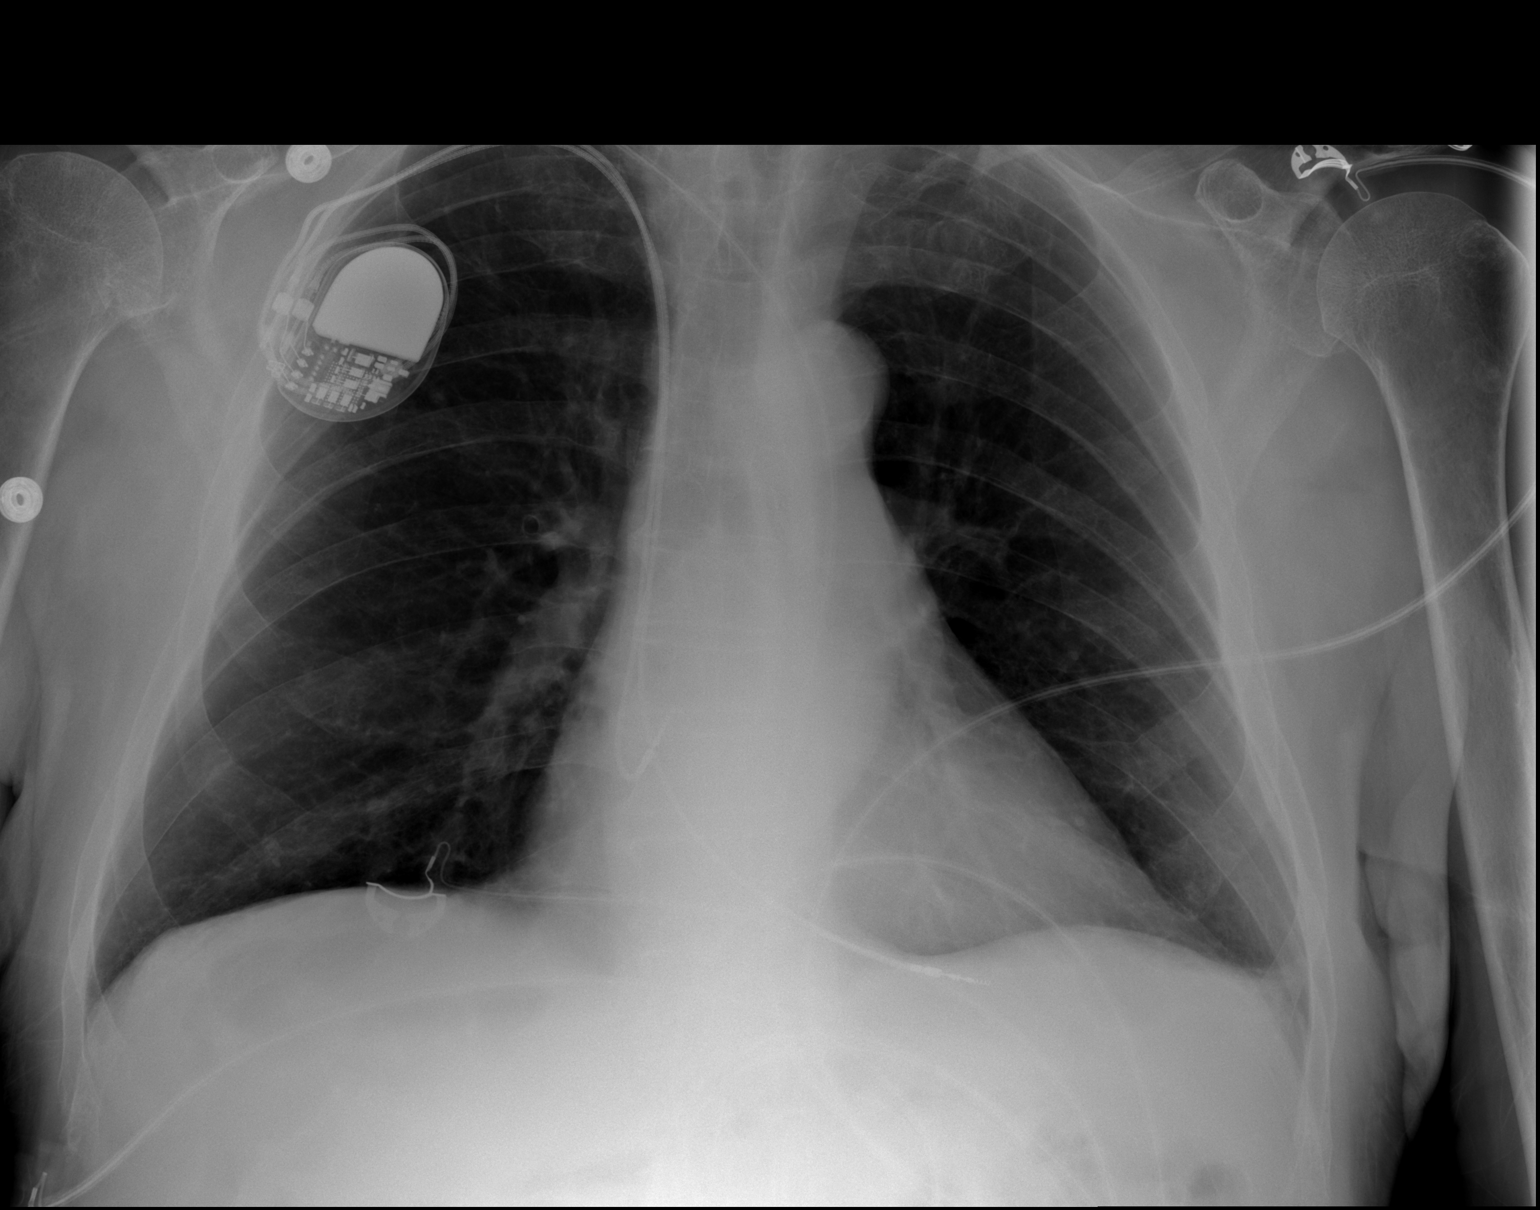

[2 of 2 positions shown; findings below may reference images not displayed]

FINDINGS: New right subclavian two lead pacer noted.  Stable heart
size and vascularity.  No pneumothorax or effusion.  Lungs remain
clear.
IMPRESSION: New right subclavian two lead pacer.  No acute chest process.

## 2014-03-14 MED ORDER — MEMANTINE HCL ER 7 MG PO CP24: ORAL_CAPSULE | ORAL | Status: DC

## 2014-03-14 NOTE — Progress Notes (Signed)
Reason for visit: Memory disturbance  Spencer Ramirez is a 81 y.o. male  History of present illness:  Spencer Ramirez is an 81 year old white male with a history of a progressive memory disturbance that has been present for about 3 years. The patient currently lives with his granddaughter after his wife died. The patient has required assistance with managing the finances for at least 3 years. The patient was having difficulty keeping up with paying the bills. The patient stopped operating a motor vehicle 2 years ago. He needs assistance with medications and appointments. He does misplace things about the house and he does repeat himself frequently. The patient has some problems with gait instability, he has been using a walker for about one year, he was using a cane prior to this. He sleeps well at night, he denies any significant fatigue. He denies any numbness or weakness of the face, arms, or legs. He does have issues controlling both the bowels and the bladder. He was on a medication for memory in the past, but this resulted in difficulty with the bowel and bladder control. He stopped this medication. He comes to this office for an evaluation. He has a pacemaker in place, he cannot have MRI evaluation.  Past Medical History  Diagnosis Date  . Chronic systolic CHF (congestive heart failure)   . Chronic venous insufficiency   . Chronic kidney disease     Stg 3  . Anticoagulant long-term use   . Dementia   . Sleep apnea   . Myocardial infarction   . Coronary artery disease   . Sick sinus syndrome   . Syncope     Secondary to SSS.  . Orthostatic hypotension   . Atrial flutter   . Atrial fibrillation   . Memory disorder 03/14/2014    Past Surgical History  Procedure Laterality Date  . Tonsillectomy    . Cardioversion  09/17/2011    Procedure: CARDIOVERSION;  Surgeon: Othella Boyer, MD;  Location: [facility] ENDOSCOPY;  Service: Cardiovascular;  Laterality: N/A;  anesth start time 1005  .  Pacemaker insertion  03/25/2012    Medtronic Adapta L dual-chamber pacemaker, serial number [id number]   . Pacemaker revision  06/17/2012    Medtronic Sensia dual-chamber pacemaker, serial number [id number]   . Permanent pacemaker insertion N/A 03/25/2012    Procedure: PERMANENT PACEMAKER INSERTION;  Surgeon: Marinus Maw, MD;  Location: [facility] CATH LAB;  Service: Cardiovascular;  Laterality: N/A;  . Lead removal N/A 05/18/2012    Procedure: Pacemaker removal;  Surgeon: Marinus Maw, MD;  Location: [facility] CATH LAB;  Service: Cardiovascular;  Laterality: N/A;  . Temporary pacemaker insertion  05/18/2012    Procedure: TEMPORARY PACEMAKER INSERTION;  Surgeon: Marinus Maw, MD;  Location: [facility] CATH LAB;  Service: Cardiovascular;;  . Permanent pacemaker insertion N/A 06/17/2012    Procedure: PERMANENT PACEMAKER INSERTION;  Surgeon: Marinus Maw, MD;  Location: [facility] CATH LAB;  Service: Cardiovascular;  Laterality: N/A;  . Pocket revision N/A 07/20/2012    Procedure: POCKET REVISION;  Surgeon: Marinus Maw, MD;  Location: [facility] CATH LAB;  Service: Cardiovascular;  Laterality: N/A;  . Cataract extraction Bilateral     Family History  Problem Relation Age of Onset  . Other      Patient does not remember  . Heart failure Father   . Dementia Mother     Social history:  reports that he has never smoked. He has never used smokeless tobacco. He  reports that he drinks about 0.6 oz of alcohol per week. He reports that he does not use illicit drugs.  Medications:  Current Outpatient Prescriptions on File Prior to Visit  Medication Sig Dispense Refill  . acetaminophen (TYLENOL) 500 MG tablet Take 500 mg by mouth every 6 (six) hours as needed for pain or fever.     . calcium carbonate (OS-CAL) 600 MG TABS Take 600 mg by mouth daily.    . carvedilol (COREG) 6.25 MG tablet Take 6.25 mg by mouth 2 (two) times daily with a meal.    . furosemide (LASIX) 40 MG tablet Take 80 mg by mouth every morning.     . hydrOXYzine  (ATARAX/VISTARIL) 10 MG tablet Take 10 mg by mouth every 6 (six) hours as needed for itching.     Marland Kitchen lisinopril (PRINIVIL,ZESTRIL) 10 MG tablet Take 5 mg by mouth every morning.     . Multiple Vitamin (MULTIVITAMIN WITH MINERALS) TABS Take 1 tablet by mouth daily.    . potassium chloride SA (K-DUR,KLOR-CON) 20 MEQ tablet Take 1 tablet (20 mEq total) by mouth daily.    . Rivaroxaban (XARELTO) 15 MG TABS tablet Take 15 mg by mouth daily with supper.    . triamcinolone cream (KENALOG) 0.5 % Apply 1 application topically 2 (two) times daily as needed. For rash     No current facility-administered medications on file prior to visit.     No Known Allergies  ROS:  Out of a complete 14 system review of symptoms, the patient complains only of the following symptoms, and all other reviewed systems are negative.  Heart murmur Itching Shortness of breath, cough Incontinence of bowel, diarrhea Urination problems, incontinence Easy bruising, easy bleeding Memory loss, confusion  Blood pressure 97/65, pulse 86, height  (1.676 m), weight 175 lb 6.4 oz (79.561 kg).  Physical Exam  General: The patient is alert and cooperative at the time of the examination.  Eyes: Pupils are equal, round, and reactive to light. Discs are flat bilaterally.  Neck: The neck is supple, no carotid bruits are noted.  Respiratory: The respiratory examination is clear.  Cardiovascular: The cardiovascular examination reveals a regular rate and rhythm, no obvious murmurs or rubs are noted.  Skin: Extremities are without significant edema.  Neurologic Exam  Mental status: The Mini-Mental Status Examination done today shows a total score 22/30.  Cranial nerves: Facial symmetry is present. There is good sensation of the face to pinprick and soft touch bilaterally. The strength of the facial muscles and the muscles to head turning and shoulder shrug are normal bilaterally. Speech is well enunciated, no aphasia or  dysarthria is noted. Extraocular movements are full. Visual fields are full. The tongue is midline, and the patient has symmetric elevation of the soft palate. No obvious hearing deficits are noted.  Motor: The motor testing reveals 5 over 5 strength of all 4 extremities. Good symmetric motor tone is noted throughout.  Sensory: Sensory testing is intact to pinprick, soft touch, vibration sensation, and position sense on all 4 extremities. No evidence of extinction is noted.  Coordination: Cerebellar testing reveals good finger-nose-finger and heel-to-shin bilaterally.  Gait and station: Gait is slightly wide-based, the patient uses a walker for ambulation. Tandem gait was not attempted. Romberg is negative. No drift is seen.  Reflexes: Deep tendon reflexes are symmetric and normal bilaterally. Toes are downgoing bilaterally.   MRI brain 03/21/12:   IMPRESSION: 1. No acute intracranial abnormality. 2. Progression of generalized cerebral volume  loss. 3. Stable gray and white matter signal in the brain, with mild for age nonspecific cerebral white matter changes.    Assessment/Plan:  1. Memory disorder  2. Gait disorder  The patient will be sent for blood work today, he will have a CT scan of the head. The patient will be placed on Namenda, as it appears he did not tolerate the other medications for memory such as Exelon or Aricept. The patient will follow-up in about 6 months. His granddaughter appears to be helping him keep up with day-to-day activities. The patient is relatively safe in the home environment.  Marlan Palau. Keith Jacody Beneke MD 03/14/2014 6:43 PM  [facility] [address] [place] [id number]  Phone [phone] Fax [phone]

## 2014-03-14 NOTE — Patient Instructions (Addendum)
Workup on the medication called Namenda by one capsule week. Once on 4 capsules daily, call our office, we will call in a maintenance prescription for the medication. Dementia Dementia is a general term for problems with brain function. A person with dementia has memory loss and a hard time with at least one other brain function such as thinking, speaking, or problem solving. Dementia can affect social functioning, how you do your job, your mood, or your personality. The changes may be hidden for a long time. The earliest forms of this disease are usually not detected by family or friends. Dementia can be:  Irreversible.  Potentially reversible.  Partially reversible.  Progressive. This means it can get worse over time. CAUSES  Irreversible dementia causes may include:  Degeneration of brain cells (Alzheimer disease or Lewy body dementia).  Multiple small strokes (vascular dementia).  Infection (chronic meningitis or Creutzfeldt-Jakob disease).  Frontotemporal dementia. This affects younger people, age 79 to 5770, compared to those who have Alzheimer disease.  Dementia associated with other disorders like Parkinson disease, Huntington disease, or HIV-associated dementia. Potentially or partially reversible dementia causes may include:  Medicines.  Metabolic causes such as excessive alcohol intake, vitamin B12 deficiency, or thyroid disease.  Masses or pressure in the brain such as a tumor, blood clot, or hydrocephalus. SIGNS AND SYMPTOMS  Symptoms are often hard to detect. Family members or coworkers may not notice them early in the disease process. Different people with dementia may have different symptoms. Symptoms can include:  A hard time with memory, especially recent memory. Long-term memory may not be impaired.  Asking the same question multiple times or forgetting something someone just said.  A hard time speaking your thoughts or finding certain words.  A hard time  solving problems or performing familiar tasks (such as how to use a telephone).  Sudden changes in mood.  Changes in personality, especially increasing moodiness or mistrust.  Depression.  A hard time understanding complex ideas that were never a problem in the past. DIAGNOSIS  There are no specific tests for dementia.   Your health care provider may recommend a thorough evaluation. This is because some forms of dementia can be reversible. The evaluation will likely include a physical exam and getting a detailed history from you and a family member. The history often gives the best clues and suggestions for a diagnosis.  Memory testing may be done. A detailed brain function evaluation called neuropsychologic testing may be helpful.  Lab tests and brain imaging (such as a CT scan or MRI scan) are sometimes important.  Sometimes observation and re-evaluation over time is very helpful. TREATMENT  Treatment depends on the cause.   If the problem is a vitamin deficiency, it may be helped or cured with supplements.  For dementias such as Alzheimer disease, medicines are available to stabilize or slow the course of the disease. There are no cures for this type of dementia.  Your health care provider can help direct you to groups, organizations, and other health care providers to help with decisions in the care of you or your loved one. HOME CARE INSTRUCTIONS The care of individuals with dementia is varied and dependent upon the progression of the dementia. The following suggestions are intended for the person living with, or caring for, the person with dementia.  Create a safe environment.  Remove the locks on bathroom doors to prevent the person from accidentally locking himself or herself in.  Use childproof latches on kitchen  cabinets and any place where cleaning supplies, chemicals, or alcohol are kept.  Use childproof covers in unused electrical outlets.  Install childproof  devices to keep doors and windows secured.  Remove stove knobs or install safety knobs and an automatic shut-off on the stove.  Lower the temperature on water heaters.  Label medicines and keep them locked up.  Secure knives, lighters, matches, power tools, and guns, and keep these items out of reach.  Keep the house free from clutter. Remove rugs or anything that might contribute to a fall.  Remove objects that might break and hurt the person.  Make sure lighting is good, both inside and outside.  Install grab rails as needed.  Use a monitoring device to alert you to falls or other needs for help.  Reduce confusion.  Keep familiar objects and people around.  Use night lights or dim lights at night.  Label items or areas.  Use reminders, notes, or directions for daily activities or tasks.  Keep a simple, consistent routine for waking, meals, bathing, dressing, and bedtime.  Create a calm, quiet environment.  Place large clocks and calendars prominently.  Display emergency numbers and home address near all telephones.  Use cues to establish different times of the day. An example is to open curtains to let the natural light in during the day.   Use effective communication.  Choose simple words and short sentences.  Use a gentle, calm tone of voice.  Be careful not to interrupt.  If the person is struggling to find a word or communicate a thought, try to provide the word or thought.  Ask one question at a time. Allow the person ample time to answer questions. Repeat the question again if the person does not respond.  Reduce nighttime restlessness.  Provide a comfortable bed.  Have a consistent nighttime routine.  Ensure a regular walking or physical activity schedule. Involve the person in daily activities as much as possible.  Limit napping during the day.  Limit caffeine.  Attend social events that stimulate rather than overwhelm the  senses.  Encourage good nutrition and hydration.  Reduce distractions during meal times and snacks.  Avoid foods that are too hot or too cold.  Monitor chewing and swallowing ability.  Continue with routine vision, hearing, dental, and medical screenings.  Give medicines only as directed by the health care provider.  Monitor driving abilities. Do not allow the person to drive when safe driving is no longer possible.  Register with an identification program which could provide location assistance in the event of a missing person situation. SEEK MEDICAL CARE IF:   New behavioral problems start such as moodiness, aggressiveness, or seeing things that are not there (hallucinations).  Any new problem with brain function happens. This includes problems with balance, speech, or falling a lot.  Problems with swallowing develop.  Any symptoms of other illness happen. Small changes or worsening in any aspect of brain function can be a sign that the illness is getting worse. It can also be a sign of another medical illness such as infection. Seeing a health care provider right away is important. SEEK IMMEDIATE MEDICAL CARE IF:   A fever develops.  New or worsened confusion develops.  New or worsened sleepiness develops.  Staying awake becomes hard to do. Document Released: 07/29/2000 Document Revised: 06/19/2013 Document Reviewed: 06/30/2010 [facility] Patient Information 2015 [place], [place]. This information is not intended to replace advice given to you by your health care provider.  Make sure you discuss any questions you have with your health care provider.  

## 2014-03-15 LAB — SPECIMEN STATUS REPORT

## 2014-03-17 LAB — RPR: RPR Ser Ql: NONREACTIVE

## 2014-03-17 LAB — TSH: TSH: 1.95 u[IU]/mL (ref 0.450–4.500)

## 2014-03-17 LAB — COPPER, SERUM: Copper: 127 ug/dL (ref 72–166)

## 2014-03-17 LAB — VITAMIN B12: VITAMIN B 12: 1407 pg/mL — AB (ref 211–946)

## 2014-03-19 ENCOUNTER — Telehealth: Payer: Self-pay | Admitting: Neurology

## 2014-03-19 NOTE — Telephone Encounter (Signed)
Spoke with patient's granddaughter, POA and relayed unremarkable blood work results, per Dr. Anne HahnWillis.

## 2014-04-13 IMAGING — CR DG CHEST 2V
2 series · 2 of 2 positions shown · non-contrast
Comparison: Chest x-ray 06/18/2012.

CLINICAL DATA: Chest pain.

CHEST - 2 VIEW

[w chest pa]
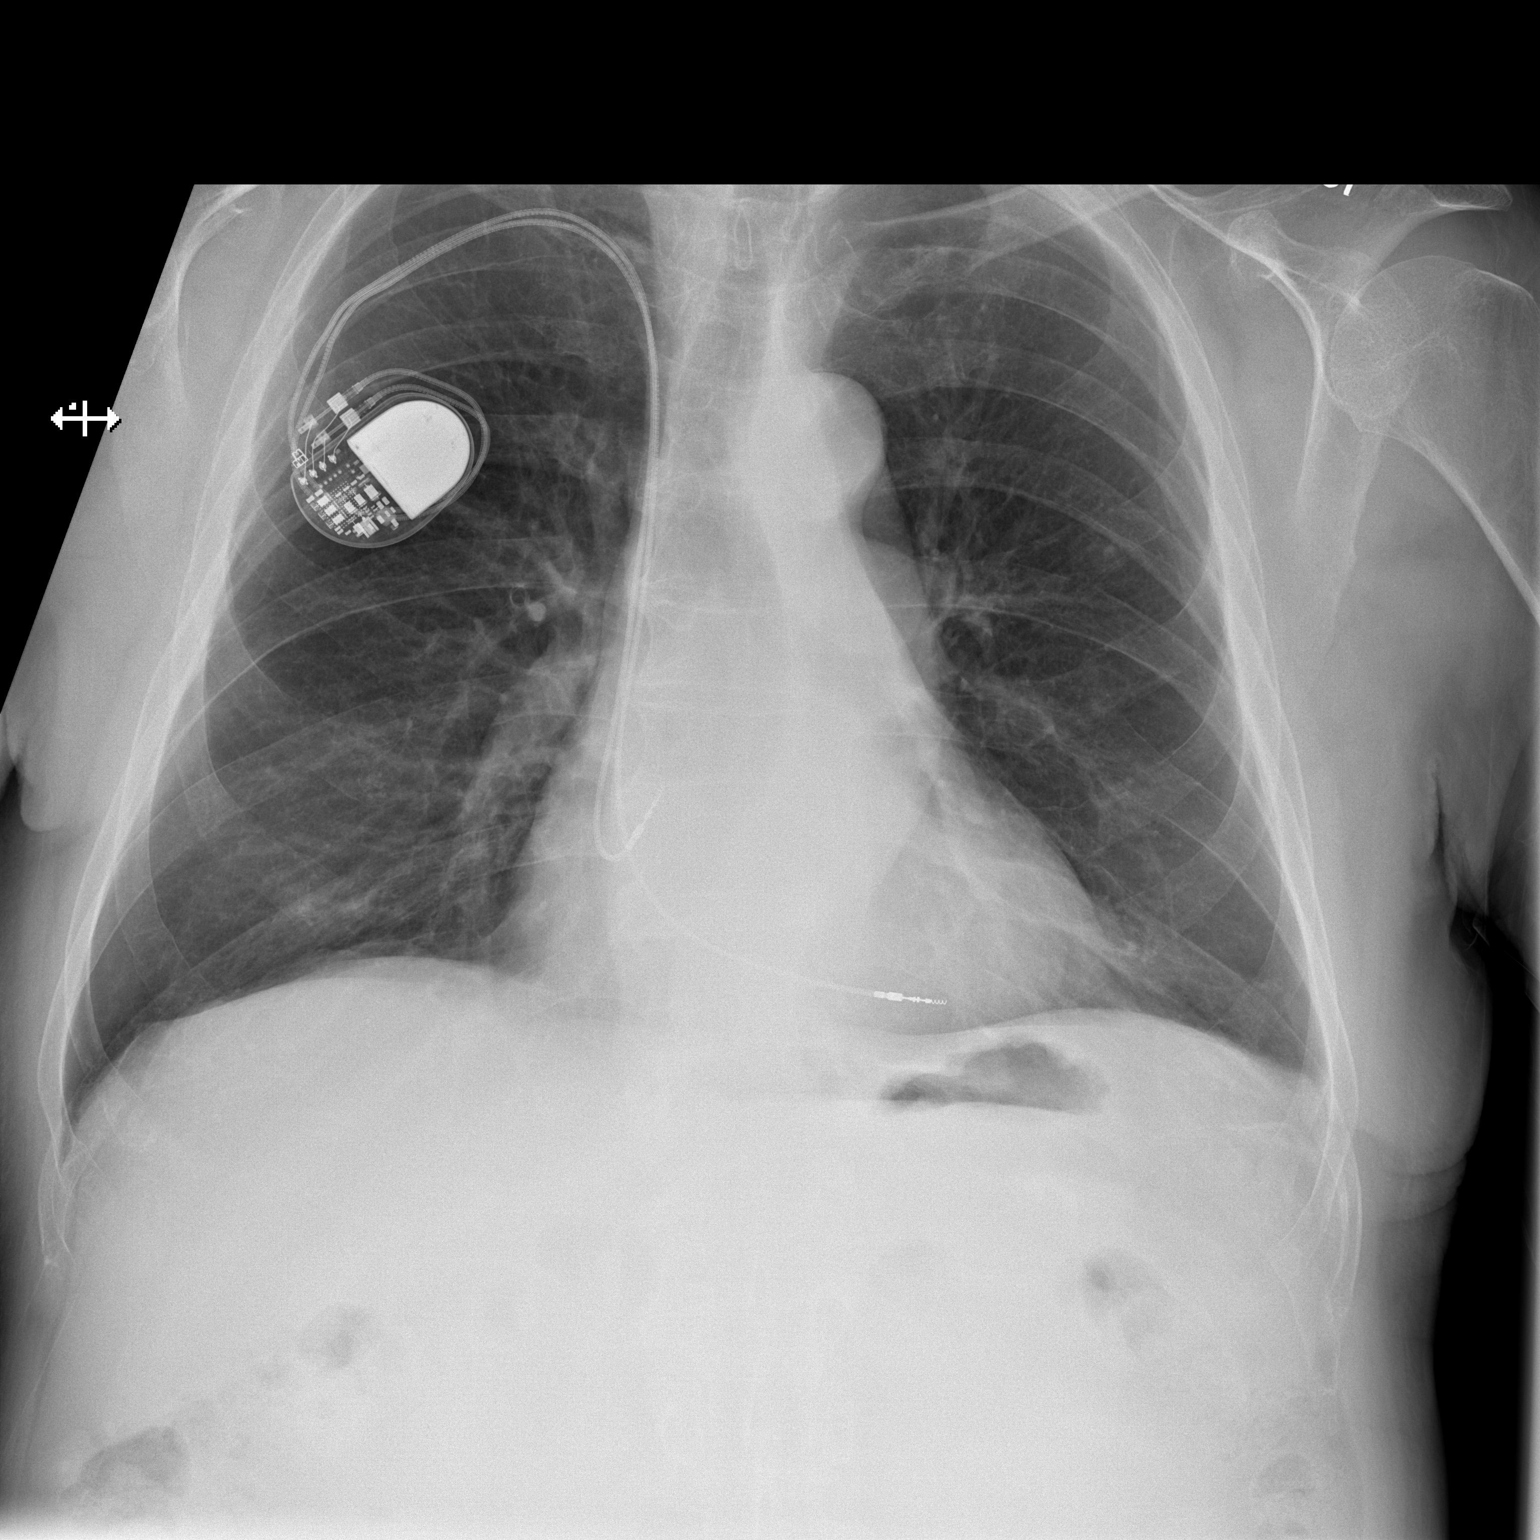

[w chest lat]
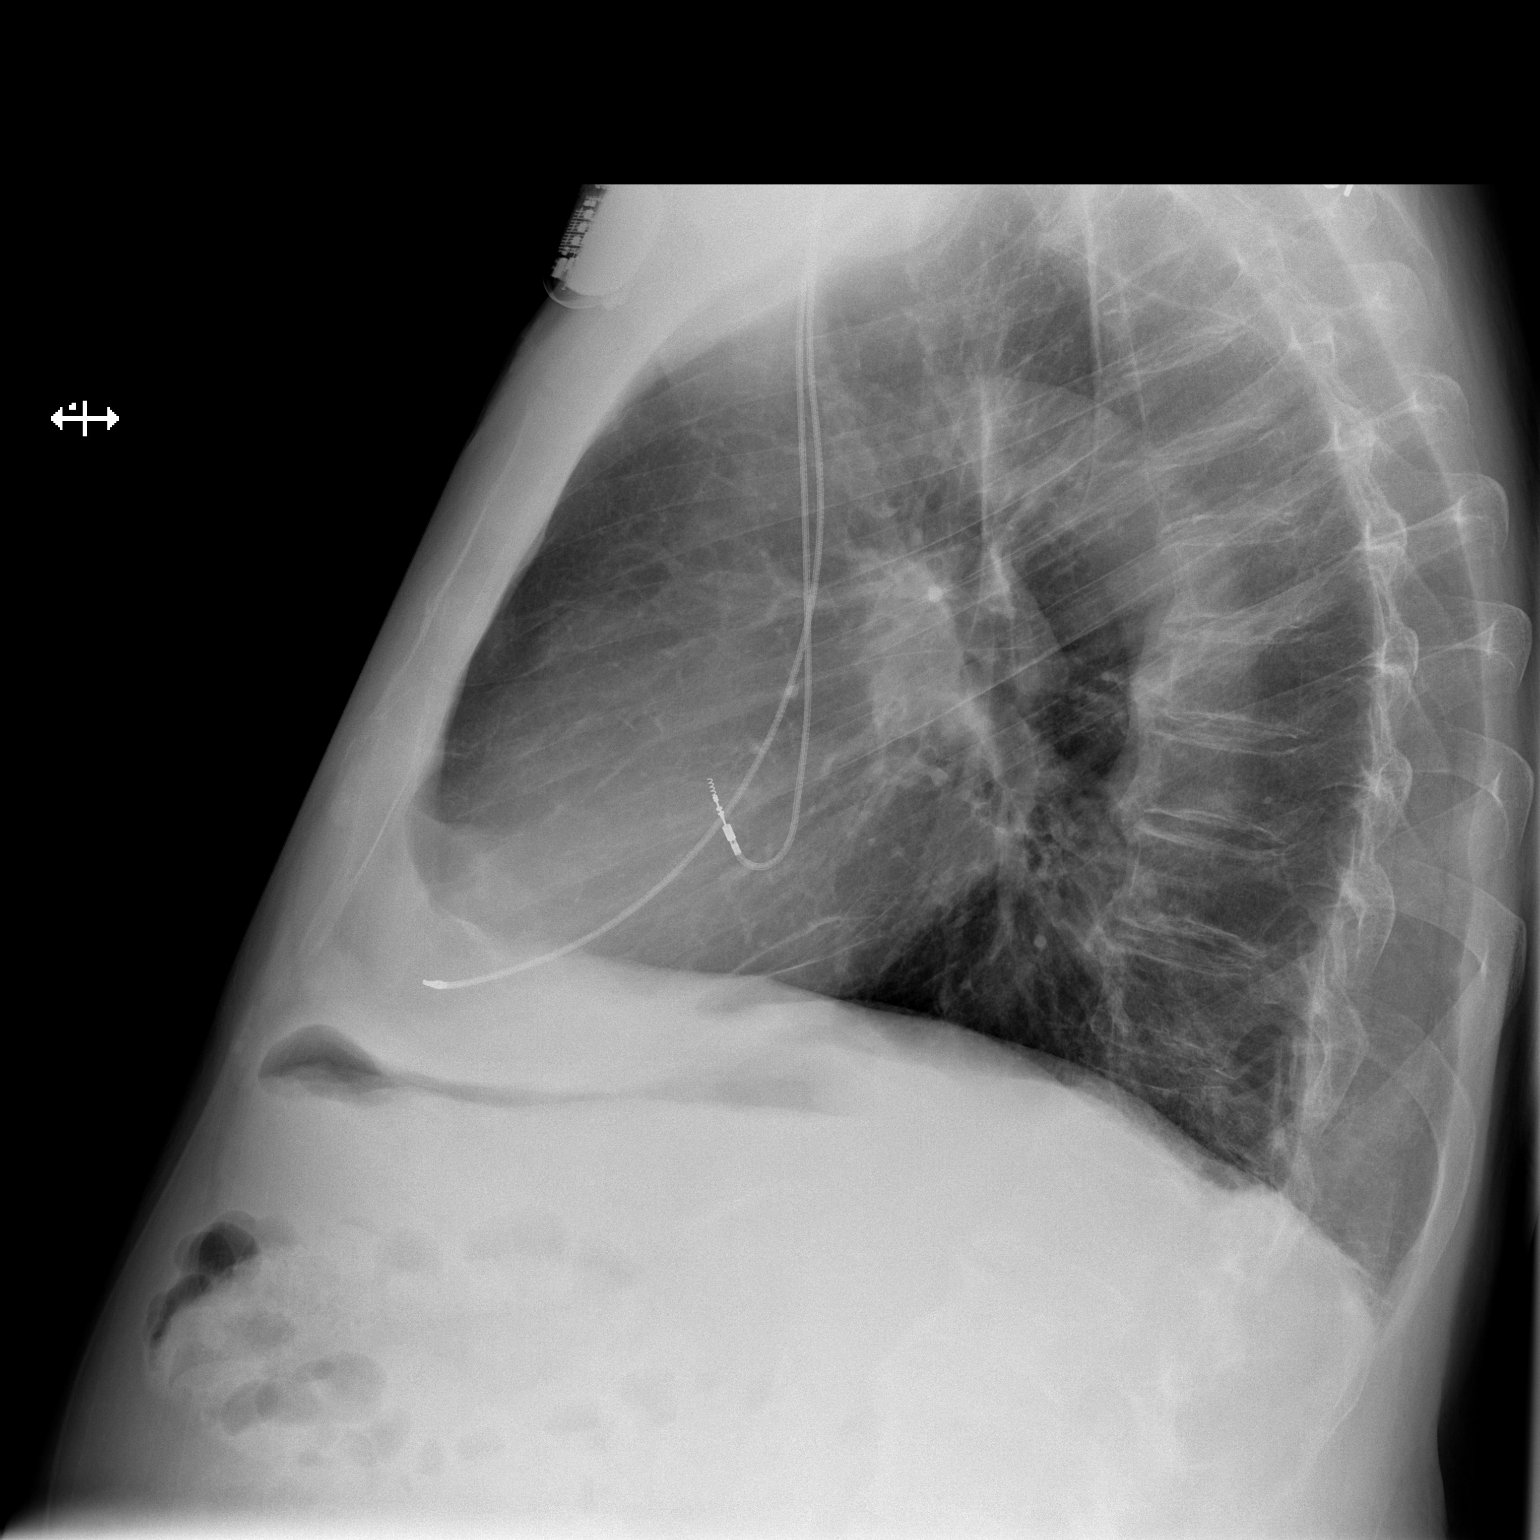

[2 of 2 positions shown; findings below may reference images not displayed]

FINDINGS: Lung volumes are normal.  No consolidative airspace
disease.  No pleural effusions.  No pneumothorax.  No pulmonary
nodule or mass noted.  Pulmonary vasculature and the
cardiomediastinal silhouette are within normal limits.  Right-sided
pacemaker device in place with lead tips projecting over the
expected location of the right atrium and right ventricular apex.
Atherosclerotic calcifications within the thoracic aorta.  Lateral
projection demonstrates the lower thoracic vertebral body
compression fracture (likely T11), which appears similar to prior
study.
IMPRESSION: 1. No radiographic evidence of acute cardiopulmonary disease.
2.  Old vertebral body compression fracture of the lower thoracic
spine (likely T11).
3.  Atherosclerosis.

## 2014-04-24 ENCOUNTER — Telehealth: Payer: Self-pay | Admitting: Neurology

## 2014-04-24 MED ORDER — MEMANTINE HCL ER 28 MG PO CP24: 28.0000 mg | ORAL_CAPSULE | Freq: Every day | ORAL | Status: DC

## 2014-04-24 NOTE — Telephone Encounter (Signed)
Pt's caretaker is calling checking on Rx for memantine (NAMENDA XR) 7 MG CP24 24 hr capsule. She states the pharmacy is waiting on authorization.  Please advise she states it's been a few days now.

## 2014-04-24 NOTE — Telephone Encounter (Signed)
Spouse called back with direct number for [facility] number # [phone].

## 2014-04-24 NOTE — Telephone Encounter (Signed)
I called and spoke with the pharmacist.  She said the patient has completed his titration dose and would like a new Rx sent for 28mg  one daily, as ins would only pay for titration dose one time.  Rx has been sent.  They will call us back if anything further is needed.

## 2014-04-30 ENCOUNTER — Ambulatory Visit
Admission: RE | Admit: 2014-04-30 | Discharge: 2014-04-30 | Disposition: A | Discharge status: Home / Self Care | Payer: Medicare Other | Source: Ambulatory Visit | Attending: Neurology | Admitting: Neurology

## 2014-04-30 ENCOUNTER — Telehealth: Payer: Self-pay | Admitting: Neurology

## 2014-04-30 DIAGNOSIS — R413 Other amnesia: Secondary | ICD-10-CM

## 2014-04-30 NOTE — Telephone Encounter (Signed)
  I called patient. CT scan of the head appears to show no significant cerebrovascular disease, mild atrophy seen.  Ct head 04/30/14:  IMPRESSION: This is an abnormal CT scan of the head showing mild to moderate cortical and cerebellar atrophy, a little more than expected for age. Compared to the MRI dated 03/21/2012, there does not appear to be any appreciable change.

## 2014-05-07 ENCOUNTER — Telehealth: Payer: Self-pay

## 2014-05-09 NOTE — Telephone Encounter (Signed)
Error

## 2014-08-21 ENCOUNTER — Other Ambulatory Visit: Payer: Self-pay | Admitting: Neurology

## 2014-08-23 ENCOUNTER — Encounter: Payer: Self-pay | Admitting: Nurse Practitioner

## 2014-08-23 ENCOUNTER — Ambulatory Visit (INDEPENDENT_AMBULATORY_CARE_PROVIDER_SITE_OTHER): Payer: Medicare Other | Admitting: Nurse Practitioner

## 2014-08-23 VITALS — BP 93/67 | HR 68 | Ht 65.5 in | Wt 178.0 lb

## 2014-08-23 DIAGNOSIS — R413 Other amnesia: Secondary | ICD-10-CM | POA: Diagnosis not present

## 2014-08-23 DIAGNOSIS — F0390 Unspecified dementia without behavioral disturbance: Secondary | ICD-10-CM

## 2014-08-23 MED ORDER — MEMANTINE HCL ER 28 MG PO CP24: 28.0000 mg | ORAL_CAPSULE | Freq: Every day | ORAL | Status: DC

## 2014-08-23 NOTE — Patient Instructions (Signed)
Continue Namenda extended release, will refill through mail order Continue to use walker for safe ambulation, at risk for falls Follow-up in 6 months for repeat memory testing

## 2014-08-23 NOTE — Progress Notes (Signed)
[facility]  PATIENT: Spencer Ramirez DOB: 1930-07-10   REASON FOR VISIT: Follow-up for memory loss  HISTORY FROM: Patient and granddaughter    HISTORY OF PRESENT ILLNESS:Spencer Ramirez is an 81 year old white male with a history of a progressive memory disturbance that has been present for about 3.5 years. The patient currently lives with his granddaughter after his wife died. The patient has required assistance with managing the finances for at least 3.5 years. The patient was having difficulty keeping up with paying the bills. The patient stopped operating a motor vehicle 2 years ago. He needs assistance with medications and appointments. He does misplace things about the house and he does repeat himself frequently. The patient has some problems with gait instability, he has been using a walker for about one and one half years. He sleeps well at night, he denies any significant fatigue. He denies any numbness or weakness of the face, arms, or legs. He does have issues controlling both the bowels and the bladder. He was on a medication Aricept  in the past, but this resulted in difficulty with the bowel and bladder control.  He comes to this office for an evaluation. He has a pacemaker in place, he cannot have MRI evaluation.CT of the head 04/30/14 was abnormal  showing mild to moderate cortical and cerebellar atrophy, a little more than expected for age. Compared to the MRI dated 03/21/2012, there does not appear to be any appreciable change.   REVIEW OF SYSTEMS: Full 14 system review of systems performed and notable only for those listed, all others are neg:  Constitutional: neg  Cardiovascular: Leg swelling Ear/Nose/Throat: neg  Skin: neg Eyes: neg Respiratory: Shortness of breath Gastroitestinal: neg  Hematology/Lymphatic: Easy bruising Endocrine: neg Musculoskeletal: Difficulty walking Allergy/Immunology: neg Neurological: Memory loss Psychiatric: Confusion Sleep :  neg   ALLERGIES: No Known Allergies  HOME MEDICATIONS: Outpatient Prescriptions Prior to Visit  Medication Sig Dispense Refill  . acetaminophen (TYLENOL) 500 MG tablet Take 500 mg by mouth every 6 (six) hours as needed for pain or fever.     . calcium carbonate (OS-CAL) 600 MG TABS Take 600 mg by mouth daily.    . carvedilol (COREG) 6.25 MG tablet Take 6.25 mg by mouth 2 (two) times daily with a meal.    . CRANBERRY PO Take 4,200 mg by mouth. Take 2 in am and 2 in pm as needed.    . furosemide (LASIX) 40 MG tablet Take 80 mg by mouth every morning.     . hydrOXYzine (ATARAX/VISTARIL) 10 MG tablet Take 10 mg by mouth every 6 (six) hours as needed for itching.     . Iron TABS Take 65 mg by mouth daily.    Marland Kitchen lisinopril (PRINIVIL,ZESTRIL) 10 MG tablet Take 5 mg by mouth every morning.     . Melatonin 5 MG CAPS Take 5 mg by mouth at bedtime.    . memantine (NAMENDA XR) 7 MG CP24 24 hr capsule Take one capsule at night for one week, then take 2 capsules at night for one week, then take 3 capsules at night for one week, then take 4 capsules daily 120 capsule 1  . Multiple Vitamin (MULTIVITAMIN WITH MINERALS) TABS Take 1 tablet by mouth daily.    Marland Kitchen MYRBETRIQ 50 MG TB24 tablet Take 50 mg by mouth daily.  11  . NAMENDA XR 28 MG CP24 24 hr capsule TAKE ONE CAPSULE BY MOUTH DAILY 30 capsule 0  . nitrofurantoin, macrocrystal-monohydrate, (  MACROBID) 100 MG capsule Take 100 mg by mouth daily.  0  . potassium chloride SA (K-DUR,KLOR-CON) 20 MEQ tablet Take 1 tablet (20 mEq total) by mouth daily.    . Probiotic Product (PROBIOTIC DAILY PO) Take by mouth daily.    . Rivaroxaban (XARELTO) 15 MG TABS tablet Take 15 mg by mouth daily with supper.    . tamsulosin (FLOMAX) 0.4 MG CAPS capsule Take 0.4 mg by mouth daily.  6  . triamcinolone cream (KENALOG) 0.5 % Apply 1 application topically 2 (two) times daily as needed. For rash     No facility-administered medications prior to visit.    PAST MEDICAL  HISTORY: Past Medical History  Diagnosis Date  . Chronic systolic CHF (congestive heart failure)   . Chronic venous insufficiency   . Chronic kidney disease     Stg 3  . Anticoagulant long-term use   . Dementia   . Sleep apnea   . Myocardial infarction   . Coronary artery disease   . Sick sinus syndrome   . Syncope     Secondary to SSS.  . Orthostatic hypotension   . Atrial flutter   . Atrial fibrillation   . Memory disorder 03/14/2014    PAST SURGICAL HISTORY: Past Surgical History  Procedure Laterality Date  . Tonsillectomy    . Cardioversion  09/17/2011    Procedure: CARDIOVERSION;  Surgeon: Othella BoyerWilliam S Tilley, MD;  [facility];  Service: Cardiovascular;  Laterality: N/A;  anesth start time 1005  . Pacemaker insertion  03/25/2012    Medtronic Adapta L dual-chamber pacemaker, serial number [id number]   . Pacemaker revision  06/17/2012    Medtronic Sensia dual-chamber pacemaker, serial number [id number]   . Permanent pacemaker insertion N/A 03/25/2012    Procedure: PERMANENT PACEMAKER INSERTION;  Surgeon: Marinus MawGregg W Taylor, MD;  [facility];  Service: Cardiovascular;  Laterality: N/A;  . Lead removal N/A 05/18/2012    Procedure: Pacemaker removal;  Surgeon: Marinus MawGregg W Taylor, MD;  [facility];  Service: Cardiovascular;  Laterality: N/A;  . Temporary pacemaker insertion  05/18/2012    Procedure: TEMPORARY PACEMAKER INSERTION;  Surgeon: Marinus MawGregg W Taylor, MD;  [facility];  Service: Cardiovascular;;  . Permanent pacemaker insertion N/A 06/17/2012    Procedure: PERMANENT PACEMAKER INSERTION;  Surgeon: Marinus MawGregg W Taylor, MD;  [facility];  Service: Cardiovascular;  Laterality: N/A;  . Pocket revision N/A 07/20/2012    Procedure: POCKET REVISION;  Surgeon: Marinus MawGregg W Taylor, MD;  [facility];  Service: Cardiovascular;  Laterality: N/A;  . Cataract extraction Bilateral     FAMILY HISTORY: Family History  Problem Relation Age of Onset  . Other        Patient does not remember  . Heart failure Father   . Dementia Mother     SOCIAL HISTORY: History   Social History  . Marital Status: Widowed    Spouse Name: N/A  . Number of Children: 4  . Years of Education: N/A   Occupational History  . Retired    Social History Main Topics  . Smoking status: Never Smoker   . Smokeless tobacco: Never Used  . Alcohol Use: 0.6 oz/week    1 Glasses of wine per week     Comment: once a week  . Drug Use: No  . Sexual Activity: Not on file   Other Topics Concern  . Not on file   Social History Narrative   Patient  is right handed.   Patient drinks 3 cups caffeine daily   Patient lives with his grandaughter Norva Riffle     PHYSICAL EXAM  Filed Vitals:   08/23/14 1010  Height: 5' 5.5" (1.664 m)  Weight: 178 lb (80.74 kg)   Body mass index is 29.16 kg/(m^2).  General: The patient is alert and cooperative at the time of the examination. Neck: The neck is supple, no carotid bruits are noted. Respiratory: The respiratory examination is clear. Cardiovascular: The cardiovascular examination reveals a regular rate and rhythm, no obvious murmurs or rubs are noted. Skin: Extremities are without significant edema.  Neurologic Exam  Mental status: The Mini-Mental Status Examination done today shows a total score 21/30.AFT 8. Clock drawing 2/4  Cranial nerves: Facial symmetry is present. There is good sensation of the face to pinprick and soft touch bilaterally. The strength of the facial muscles and the muscles to head turning and shoulder shrug are normal bilaterally. Speech is well enunciated, no aphasia or dysarthria is noted. Pupils are equal, round, and reactive to light. Discs are flat bilaterally.Extraocular movements are full. Visual fields are full. The tongue is midline, and the patient has symmetric elevation of the soft palate. No obvious hearing deficits are noted. Motor: The motor testing reveals 5 over 5 strength of all 4  extremities. Good symmetric motor tone is noted throughout. Sensory: Sensory testing is intact to pinprick, soft touch, vibration sensation, and position sense on all 4 extremities.  Coordination: Cerebellar testing reveals good finger-nose-finger and heel-to-shin bilaterally. Gait and station: Gait is slightly wide-based, the patient uses a walker for ambulation. Tandem gait was not attempted. Romberg is negative. No drift is seen. Reflexes: Deep tendon reflexes are symmetric and normal bilaterally. Toes are downgoing bilaterally.   DIAGNOSTIC DATA (LABS, IMAGING, TESTING) -   Lab Results  Component Value Date   TSH 1.950 03/14/2014      ASSESSMENT AND PLAN  81 y.o. year old male  has a past medical history of 1. Memory disorder 2. Gait disorder CT of the head 04/30/14 was abnormal  showing mild to moderate cortical and cerebellar atrophy, a little more than expected for age. Compared to the MRI dated 03/21/2012, there does not appear to be any appreciable change.   Continue Namenda extended release, will refill through mail order Continue to use walker for safe ambulation, at risk for falls Follow-up in 6 months for repeat memory testing Nilda Riggs, [facility], [facility], APRN  [facility] [address] [place], [place] [address] [phone]

## 2014-08-23 NOTE — Progress Notes (Signed)
I have read the note, and I agree with the clinical assessment and plan.  Kambrea Carrasco KEITH   

## 2015-01-30 ENCOUNTER — Other Ambulatory Visit: Payer: Self-pay | Admitting: Nurse Practitioner

## 2015-02-26 ENCOUNTER — Ambulatory Visit: Payer: Medicare Other | Admitting: Nurse Practitioner

## 2015-03-20 DEATH — deceased
